# Patient Record
Sex: Female | Born: 1995
Health system: Southern US, Community
[De-identification: ages and names within clinical notes are randomized; demographics above are authoritative.]

## PROBLEM LIST (undated history)

## (undated) DIAGNOSIS — D649 Anemia, unspecified: Secondary | ICD-10-CM

## (undated) DIAGNOSIS — E0500 Thyrotoxicosis with diffuse goiter without thyrotoxic crisis or storm: Secondary | ICD-10-CM

## (undated) DIAGNOSIS — N1831 Chronic kidney disease, stage 3a: Secondary | ICD-10-CM

## (undated) DIAGNOSIS — E109 Type 1 diabetes mellitus without complications: Secondary | ICD-10-CM

## (undated) HISTORY — DX: Anemia, unspecified: D64.9

## (undated) HISTORY — DX: Type 1 diabetes mellitus without complications: E10.9

## (undated) HISTORY — DX: Thyrotoxicosis with diffuse goiter without thyrotoxic crisis or storm: E05.00

## (undated) HISTORY — DX: Chronic kidney disease, stage 3a: N18.31

---
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
## 2014-06-05 DIAGNOSIS — D649 Anemia, unspecified: Secondary | ICD-10-CM | POA: Insufficient documentation

## 2014-06-05 DIAGNOSIS — E109 Type 1 diabetes mellitus without complications: Secondary | ICD-10-CM | POA: Insufficient documentation

## 2017-10-29 DIAGNOSIS — B009 Herpesviral infection, unspecified: Secondary | ICD-10-CM | POA: Insufficient documentation

## 2017-11-02 HISTORY — PX: WISDOM TOOTH EXTRACTION: SHX21

## 2018-12-14 DIAGNOSIS — E0500 Thyrotoxicosis with diffuse goiter without thyrotoxic crisis or storm: Secondary | ICD-10-CM | POA: Insufficient documentation

## 2018-12-14 DIAGNOSIS — E1065 Type 1 diabetes mellitus with hyperglycemia: Secondary | ICD-10-CM | POA: Insufficient documentation

## 2019-01-13 DIAGNOSIS — Z9641 Presence of insulin pump (external) (internal): Secondary | ICD-10-CM | POA: Insufficient documentation

## 2019-08-28 DIAGNOSIS — Z975 Presence of (intrauterine) contraceptive device: Secondary | ICD-10-CM | POA: Insufficient documentation

## 2020-08-19 ENCOUNTER — Encounter: Payer: Self-pay | Admitting: Medical

## 2020-08-19 ENCOUNTER — Other Ambulatory Visit: Payer: Self-pay

## 2020-08-19 ENCOUNTER — Telehealth: Payer: Self-pay | Admitting: Medical

## 2020-08-19 DIAGNOSIS — B370 Candidal stomatitis: Secondary | ICD-10-CM

## 2020-08-19 DIAGNOSIS — R070 Pain in throat: Secondary | ICD-10-CM

## 2020-08-19 DIAGNOSIS — N939 Abnormal uterine and vaginal bleeding, unspecified: Secondary | ICD-10-CM | POA: Insufficient documentation

## 2020-08-19 DIAGNOSIS — E10638 Type 1 diabetes mellitus with other oral complications: Secondary | ICD-10-CM

## 2020-08-19 MED ORDER — CLOTRIMAZOLE 10 MG MT TROC: 10.0000 mg | Freq: Every day | OROMUCOSAL | 0 refills | Status: DC

## 2020-08-19 NOTE — Progress Notes (Signed)
   Subjective:    Patient ID: Patricia Ramirez, female    DOB: 1996-02-18, 25 y.o.   MRN: [id number]   HPI 24 yo female in non acute distress, consents to  Telemedicine appointment. She has had throat discomfort x 1 year.  She was seen at an urgent care 1 week ago was negative Covid-19 test . She just moved here from [place] and after 2 years has moved back to [place]. She takes Zyrtec and Fluticasone daily for allergies. She feels that her symptoms ( throat discomfort) mild nasal stuffiness are just her allergies but not 100% sure.    Moderna vaccinated on April 6th 2021 Booster Moderna on  September 7th 2021  Pregnancy status:  Has Mirena, not pregnant   Review of Systems  Constitutional: Negative for chills and fever.  HENT: Positive for congestion (he normal allergy symptoms) and sore throat. Negative for rhinorrhea, sinus pressure and sinus pain.   Gastrointestinal: Negative for abdominal pain, diarrhea, nausea and vomiting.  Genitourinary: Negative for dysuria.  Musculoskeletal: Negative for myalgias.  Skin: Negative for rash.  Allergic/Immunologic: Positive for environmental allergies (worse in [place] just moved back fron [place] stayed 2 years)).  Neurological: Negative for dizziness, syncope, light-headedness and headaches.  Hematological: Positive for adenopathy (tender on left side and milldly enlarged per patient)..   No history of indigestion. She did try a 2 week regimen and states it helps some but did not resolve the throat discomfort.   No Shortness of breath, no cough, no chest pain. Hot or spicy or acidic foods do not bother her throat. Marland Kitchen    YO:MAYO  meds Zyrtec, Fluticasone, Novolog    Family Hx Father CAD and A-Fib Mother with hyperlipidemia,hypothyroidism, Lupus ,  Hx of Breast Ca in family, though her mother has not had it.  Colonoscopy at age  76yo while she was being worked up for Type 1 DM    Objective:   Physical Exam  AXOX3 No  physical exam due to telemedicine appointment.      Assessment & Plan:  Throat discomfort x 1 year Will refer to ENT Will treat as if it is candidiasis of the throat with  Meds ordered this encounter  Medications  . clotrimazole (MYCELEX) 10 MG troche    Sig: Take 1 tablet (10 mg total) by mouth 5 (five) times daily. Let dissolve in mouth    Dispense:  50 Troche    Refill:  0  if patient does not hear from ENT in 7 business days to call the office. Patient verbalizes understanding and has no questions at the end of our conversation. I asked patient to keep me updated on her diagnosis she agrees she will do so.

## 2020-08-19 NOTE — Patient Instructions (Signed)
Pharyngitis  Pharyngitis is a sore throat (pharynx). This is when there is redness, pain, and swelling in your throat. Most of the time, this condition gets better on its own. In some cases, you may need medicine. Follow these instructions at home:  Take over-the-counter and prescription medicines only as told by your doctor. ? If you were prescribed an antibiotic medicine, take it as told by your doctor. Do not stop taking the antibiotic even if you start to feel better. ? Do not give children aspirin. Aspirin has been linked to Reye syndrome.  Drink enough water and fluids to keep your pee (urine) clear or pale yellow.  Get a lot of rest.  Rinse your mouth (gargle) with a salt-water mixture 3-4 times a day or as needed. To make a salt-water mixture, completely dissolve -1 tsp of salt in 1 cup of warm water.  If your doctor approves, you may use throat lozenges or sprays to soothe your throat. Contact a doctor if:  You have large, tender lumps in your neck.  You have a rash.  You cough up green, yellow-brown, or bloody spit. Get help right away if:  You have a stiff neck.  You drool or cannot swallow liquids.  You cannot drink or take medicines without throwing up.  You have very bad pain that does not go away with medicine.  You have problems breathing, and it is not from a stuffy nose.  You have new pain and swelling in your knees, ankles, wrists, or elbows. Summary  Pharyngitis is a sore throat (pharynx). This is when there is redness, pain, and swelling in your throat.  If you were prescribed an antibiotic medicine, take it as told by your doctor. Do not stop taking the antibiotic even if you start to feel better.  Most of the time, pharyngitis gets better on its own. Sometimes, you may need medicine. This information is not intended to replace advice given to you by your health care provider. Make sure you discuss any questions you have with your health care  provider. Document Revised: 10/01/2017 Document Reviewed: 11/24/2016 Elsevier Patient Education  2020 Elsevier Inc.  

## 2020-10-07 DIAGNOSIS — E559 Vitamin D deficiency, unspecified: Secondary | ICD-10-CM | POA: Diagnosis not present

## 2020-10-07 DIAGNOSIS — E1069 Type 1 diabetes mellitus with other specified complication: Secondary | ICD-10-CM | POA: Diagnosis not present

## 2020-10-07 DIAGNOSIS — E782 Mixed hyperlipidemia: Secondary | ICD-10-CM | POA: Diagnosis not present

## 2020-10-07 DIAGNOSIS — E109 Type 1 diabetes mellitus without complications: Secondary | ICD-10-CM | POA: Diagnosis not present

## 2020-10-22 ENCOUNTER — Ambulatory Visit (INDEPENDENT_AMBULATORY_CARE_PROVIDER_SITE_OTHER): Payer: BC Managed Care – PPO | Admitting: Obstetrics and Gynecology

## 2020-10-22 ENCOUNTER — Encounter: Payer: Self-pay | Admitting: Obstetrics and Gynecology

## 2020-10-22 ENCOUNTER — Other Ambulatory Visit: Payer: Self-pay

## 2020-10-22 VITALS — BP 122/82 | HR 89 | Ht 64.5 in | Wt 205.1 lb

## 2020-10-22 DIAGNOSIS — Z8619 Personal history of other infectious and parasitic diseases: Secondary | ICD-10-CM

## 2020-10-22 DIAGNOSIS — E109 Type 1 diabetes mellitus without complications: Secondary | ICD-10-CM | POA: Diagnosis not present

## 2020-10-22 DIAGNOSIS — Z01419 Encounter for gynecological examination (general) (routine) without abnormal findings: Secondary | ICD-10-CM | POA: Diagnosis not present

## 2020-10-22 MED ORDER — VALACYCLOVIR HCL 500 MG PO TABS: 500.0000 mg | ORAL_TABLET | Freq: Every day | ORAL | 3 refills | Status: DC

## 2020-10-22 NOTE — Progress Notes (Signed)
HPI:      [name] is a 24 y.o. G0P0000 who LMP was No LMP recorded. (Menstrual status: IUD).  Subjective:   She presents today for her annual examination and to establish care. She has no complaints. She has an IUD in place for birth control. She is not having menses with it. She had a placed in 2017 (Mirena). Patient has type 1 diabetes and has an insulin pump. She is followed by endocrinology. Her last Pap smear was 2 years ago and she reports her Pap was normal.    Hx: The following portions of the patient's history were reviewed and updated as appropriate:             She  has a past medical history of Anemia, Graves' disease in remission, and Type 1 diabetes ([place]). She does not have a problem list on file. She  has a past surgical history that includes Colonoscopy (2004) and Wisdom tooth extraction (2019). Her family history includes Hypertension in her father. She  reports that she has never smoked. She has never used smokeless tobacco. She reports current alcohol use. She reports that she does not use drugs. She has a current medication list which includes the following prescription(s): alprazolam, contour next monitor, vitamin d3, contour next test, novolog, and valacyclovir. She is allergic to tuberculin tests.       Review of Systems:  Review of Systems  Constitutional: Denied constitutional symptoms, night sweats, recent illness, fatigue, fever, insomnia and weight loss.  Eyes: Denied eye symptoms, eye pain, photophobia, vision change and visual disturbance.  Ears/Nose/Throat/Neck: Denied ear, nose, throat or neck symptoms, hearing loss, nasal discharge, sinus congestion and sore throat.  Cardiovascular: Denied cardiovascular symptoms, arrhythmia, chest pain/pressure, edema, exercise intolerance, orthopnea and palpitations.  Respiratory: Denied pulmonary symptoms, asthma, pleuritic pain, productive sputum, cough, dyspnea and wheezing.  Gastrointestinal: Denied,  gastro-esophageal reflux, melena, nausea and vomiting.  Genitourinary: Denied genitourinary symptoms including symptomatic vaginal discharge, pelvic relaxation issues, and urinary complaints.  Musculoskeletal: Denied musculoskeletal symptoms, stiffness, swelling, muscle weakness and myalgia.  Dermatologic: Denied dermatology symptoms, rash and scar.  Neurologic: Denied neurology symptoms, dizziness, headache, neck pain and syncope.  Psychiatric: Denied psychiatric symptoms, anxiety and depression.  Endocrine: Denied endocrine symptoms including hot flashes and night sweats.   Meds:   Current Outpatient Medications on File Prior to Visit  Medication Sig Dispense Refill  . ALPRAZolam (XANAX) 0.25 MG tablet Take 0.25 mg by mouth at bedtime as needed for anxiety.    . Blood Glucose Monitoring Suppl (CONTOUR NEXT MONITOR) w/Device KIT     . Cholecalciferol (VITAMIN D3) 75 MCG (3000 UT) TABS Take by mouth.    . CONTOUR NEXT TEST test strip     . NOVOLOG 100 UNIT/ML injection Inject into the skin.    . valACYclovir (VALTREX) 500 MG tablet Take 500 mg by mouth daily.     No current facility-administered medications on file prior to visit.       Upstream - 10/22/20 1447      Pregnancy Intention Screening   Does the patient want to become pregnant in the next year? No    Does the patient's partner want to become pregnant in the next year? No    Would the patient like to discuss contraceptive options today? Yes          The pregnancy intention screening data noted above was reviewed. Potential methods of contraception were discussed. The patient elected to proceed with IUD or  IUS.     Objective:     Vitals:   10/22/20 1438  BP: 122/82  Pulse: 89    Filed Weights   10/22/20 1438  Weight: 205 lb 1.6 oz (93 kg)              Physical examination General NAD, Conversant  HEENT Atraumatic; Op clear with mmm.  Normo-cephalic. Pupils reactive. Anicteric sclerae  Thyroid/Neck Smooth  without nodularity or enlargement. Normal ROM.  Neck Supple.  Skin No rashes, lesions or ulceration. Normal palpated skin turgor. No nodularity.  Breasts: No masses or discharge.  Symmetric.  No axillary adenopathy.  Lungs: Clear to auscultation.No rales or wheezes. Normal Respiratory effort, no retractions.  Heart: NSR.  No murmurs or rubs appreciated. No periferal edema  Abdomen: Soft.  Non-tender.  No masses.  No HSM. No hernia  Extremities: Moves all appropriately.  Normal ROM for age. No lymphadenopathy.  Neuro: Oriented to PPT.  Normal mood. Normal affect.     Pelvic:   Vulva: Normal appearance.  No lesions.  Vagina: No lesions or abnormalities noted.  Support: Normal pelvic support.  Urethra No masses tenderness or scarring.  Meatus Normal size without lesions or prolapse.  Cervix: Normal appearance.  No lesions. IUD strings noted at cervical os  Anus: Normal exam.  No lesions.  Perineum: Normal exam.  No lesions.        Bimanual   Uterus: Normal size.  Non-tender.  Mobile.  AV.  Adnexae: No masses.  Non-tender to palpation.  Cul-de-sac: Negative for abnormality.      Assessment:    G0P0000 There are no problems to display for this patient.    1. Well woman exam with routine gynecological exam   2. Type 1 diabetes mellitus without complication (HCC)   3. History of PCR DNA positive for HSV1     Normal exam.   Plan:            1.  Basic Screening Recommendations The basic screening recommendations for asymptomatic women were discussed with the patient during her visit.  The age-appropriate recommendations were discussed with her and the rational for the tests reviewed.  When I am informed by the patient that another primary care physician has previously obtained the age-appropriate tests and they are up-to-date, only outstanding tests are ordered and referrals given as necessary.  Abnormal results of tests will be discussed with her when all of her results are  completed.  Routine preventative health maintenance measures emphasized: Exercise/Diet/Weight control, Tobacco Warnings, Alcohol/Substance use risks and Stress Management Pap next year 2. Patient discontinued Valtrex and had an outbreak this year. She would like to remain on daily Valtrex for at least the next year to prevent recurrence. Orders No orders of the defined types were placed in this encounter.   No orders of the defined types were placed in this encounter.           F/U  Return in about 1 year (around 10/22/2021) for Annual Physical.  Finis Bud, M.D. 10/22/2020 3:19 PM

## 2020-10-29 ENCOUNTER — Telehealth: Payer: Self-pay

## 2020-10-29 NOTE — Telephone Encounter (Signed)
Patient called in stating she received a text message from MyChart about verifying a Rx that was pending a refill. Patient states it was asking her to verify her DOB and when she inputted that information it told her that the DOB didn't match the patients demographics. Patient states she is needing a refill on her Valtrex and wanted to make sure she is able to get that refilled since she is unable to verify the refill via the link that was sent to her.   Could you please advise?

## 2020-10-29 NOTE — Telephone Encounter (Signed)
LM for patient to return call.

## 2020-10-31 NOTE — Telephone Encounter (Signed)
LM for patient that RX had been sent in to pharmacy the day of appointment. If she has any questions to give [place] a call back.

## 2020-11-05 ENCOUNTER — Telehealth: Payer: Self-pay | Admitting: Medical

## 2020-11-05 ENCOUNTER — Other Ambulatory Visit: Payer: Self-pay

## 2020-11-05 DIAGNOSIS — E10638 Type 1 diabetes mellitus with other oral complications: Secondary | ICD-10-CM

## 2020-11-05 DIAGNOSIS — B370 Candidal stomatitis: Secondary | ICD-10-CM

## 2020-11-05 DIAGNOSIS — R070 Pain in throat: Secondary | ICD-10-CM

## 2020-11-05 DIAGNOSIS — Z760 Encounter for issue of repeat prescription: Secondary | ICD-10-CM

## 2020-11-05 MED ORDER — CLOTRIMAZOLE 10 MG MT TROC: 10.0000 mg | Freq: Every day | OROMUCOSAL | 0 refills | Status: DC

## 2020-11-05 NOTE — Progress Notes (Signed)
   Subjective:    Patient ID: Patricia Ramirez, female    DOB: 09/22/1996, 25 y.o.   MRN: [id number]  HPI  25 yo female in non acute distress, consents to telemedicine appointment. Started 7 days ago with  White on tongue and on back of throat. States that the area is painful. She is is a IDDM patient. The last time I treated patient for thrush was Oct.18,21. She states this feels just like before. She would like a refill of the Cloritrimazole troches. No known Covid exposure.  Review of Systems  Constitutional: Negative for chills and fever.  HENT: Positive for sore throat. Negative for congestion.   Respiratory: Negative for cough, chest tightness, shortness of breath and wheezing.   Cardiovascular: Negative for chest pain.  Genitourinary: Negative for difficulty urinating.  Skin: Negative for color change and rash.  Neurological: Negative for dizziness, syncope, light-headedness and headaches.       Objective:   Physical Exam  AXOX3 No physical was performed due to telemedicine appointment.      Assessment & Plan:  Candidiasis Refill of medication She has appointnment with a PCP in the next 2 weeks. She may return to this clinic as needed.  She verbalizes understanding and has no questions at the end of our conversation.

## 2020-11-15 ENCOUNTER — Ambulatory Visit: Payer: BC Managed Care – PPO

## 2020-11-15 ENCOUNTER — Telehealth: Payer: BC Managed Care – PPO | Admitting: Nurse Practitioner

## 2020-11-15 ENCOUNTER — Other Ambulatory Visit: Payer: Self-pay

## 2020-11-15 DIAGNOSIS — Z20822 Contact with and (suspected) exposure to covid-19: Secondary | ICD-10-CM

## 2020-11-15 DIAGNOSIS — J029 Acute pharyngitis, unspecified: Secondary | ICD-10-CM

## 2020-11-15 LAB — POC COVID19 BINAXNOW: SARS Coronavirus 2 Ag: NEGATIVE

## 2020-11-15 NOTE — Progress Notes (Signed)
   Subjective:    Patient ID: Patricia Ramirez, female    DOB: 1996-09-18, 25 y.o.   MRN: [id number]  HPI  25 year old female presenting to clinic today for COVID testing   She has been vaccinated for COVID-19 with a booster  One week ago she was congested with a sore throat, she was not tested at that time then her husband tested positive this morning so she presents to clinic today for COVID testing.   SpO2 99% Heart Rate 92  Phone [phone]    Past Medical History:  Diagnosis Date  . Anemia   . Graves' disease in remission   . Type 1 diabetes (HCC)    Review of Systems  Constitutional: Negative.   HENT: Positive for congestion and sore throat.   Respiratory: Negative.   Cardiovascular: Negative.   Genitourinary: Negative.   Musculoskeletal: Negative.        Objective:   Physical Exam  This was a telehealth appointment with patient after nurse visit for COVID-19 testing.   No acute distress assessed during nurse visit or phone conversation with patient   Recent Results (from the past 2160 hour(s))  POC COVID-19     Status: Normal   Collection Time: 11/15/20  1:32 PM  Result Value Ref Range   SARS Coronavirus 2 Ag Negative Negative      Assessment & Plan:  Will send out PCR for confirmation of negative test today  Advised to isolate until PCR returns, treat symptoms with OTC medications.   Will discuss return to work when PCR returns, she has had symptoms for over one week already and then had negative rapid testing so likely if symptoms have improved without any new onset of symptoms she is already outside of her isolation period.   She will seek immediate medical attention for any new or worsening symptoms

## 2020-11-15 NOTE — Progress Notes (Signed)
Patient was vaccinated with booster, was symptomatic with fever, cough, yeast infection as per pt week ago and sore throat aware of POC negative result has virtual with Viviano Simas FNP.

## 2020-11-17 ENCOUNTER — Encounter: Payer: Self-pay | Admitting: Nurse Practitioner

## 2020-11-17 LAB — SARS-COV-2, NAA 2 DAY TAT

## 2020-11-17 LAB — NOVEL CORONAVIRUS, NAA: SARS-CoV-2, NAA: NOT DETECTED

## 2021-01-27 DIAGNOSIS — E559 Vitamin D deficiency, unspecified: Secondary | ICD-10-CM | POA: Diagnosis not present

## 2021-01-29 DIAGNOSIS — E109 Type 1 diabetes mellitus without complications: Secondary | ICD-10-CM | POA: Diagnosis not present

## 2021-03-12 ENCOUNTER — Telehealth: Payer: BC Managed Care – PPO | Admitting: Medical

## 2021-03-12 ENCOUNTER — Other Ambulatory Visit: Payer: Self-pay

## 2021-03-12 DIAGNOSIS — U071 COVID-19: Secondary | ICD-10-CM

## 2021-03-12 NOTE — Progress Notes (Signed)
   Subjective:    Patient ID: Patricia Ramirez, female    DOB: [birth year], 25 y.o.   MRN: [id number]  HPI 25 yo female in non acute distress consents to telemedicine appointment. She tested positive yesterday. She has 2 close contacts.Her symptoms are nasal congestion, cough ,possibly a fever but had not taken her temperature. Post nasal drip, runnny nose and  ST. Mild shortness of breath. Patient had Covid in Jan 2022 and she is a type I diabetic.  Covid in Jan. 2022 No lost of taste or smell Review of Systems  Constitutional: Positive for chills and fever (unsure has not felt feverish).  HENT: Positive for congestion, postnasal drip, rhinorrhea, sinus pressure and sore throat. Negative for ear pain and sneezing.   Respiratory: Positive for cough and shortness of breath (mild).   Gastrointestinal: Positive for abdominal pain and diarrhea.  Allergic/Immunologic: Positive for environmental allergies (mild does not usually medicate).  Neurological: Positive for dizziness (baseline due to her diabetes.) and headaches (frontal and cheek pressure).     campus on Monday tested negative Objective:   Physical Exam  No physical exam due to telemedicine appointment      Assessment & Plan:  Covid -19 infection Isolate, rest, increase fluids, OTC Motrin or Tylenol for fever or pain.May return to work on May 16 if all symptoms are improving and no fever x 24 hours with no Miotrin or Tylenol being taken. If sugar levels increase she is to call her endocrinologist. Patient verbalizes understanding and has no questions  At the end of our conversation.

## 2021-03-12 NOTE — Patient Instructions (Signed)

## 2021-05-07 ENCOUNTER — Ambulatory Visit: Payer: BC Managed Care – PPO | Admitting: Nurse Practitioner

## 2021-05-07 ENCOUNTER — Other Ambulatory Visit: Payer: Self-pay

## 2021-05-07 VITALS — BP 120/88 | HR 106 | Temp 99.4°F | Resp 16

## 2021-05-07 DIAGNOSIS — J029 Acute pharyngitis, unspecified: Secondary | ICD-10-CM

## 2021-05-07 DIAGNOSIS — E109 Type 1 diabetes mellitus without complications: Secondary | ICD-10-CM

## 2021-05-07 DIAGNOSIS — B370 Candidal stomatitis: Secondary | ICD-10-CM

## 2021-05-07 LAB — POCT RAPID STREP A (OFFICE): Rapid Strep A Screen: NEGATIVE

## 2021-05-07 LAB — POC COVID19 BINAXNOW: SARS Coronavirus 2 Ag: NEGATIVE

## 2021-05-07 MED ORDER — NYSTATIN 100000 UNIT/ML MT SUSP: OROMUCOSAL | 1 refills | Status: DC

## 2021-05-07 MED ORDER — FLUCONAZOLE 150 MG PO TABS: 150.0000 mg | ORAL_TABLET | Freq: Once | ORAL | 0 refills | Status: AC

## 2021-05-07 NOTE — Progress Notes (Signed)
Subjective:    Patient ID: Patricia Ramirez, female    DOB: 11/25/1995, 25 y.o.   MRN: [id number]  HPI  25 year old female presenting to [facility] with complaints of sore throat for the past 2 weeks. She has a history of a DM and her Blood glucose levels have been high as well for the past 2 weeks. She has a pump and continuosu monitor glucose levels have been up to 400.   Next Endocrine appointment is 06/02/21  She has had a history of oral thrush in the past.    She did take a home COVID test that was negative.   She has also had HA and some body aches that she related to her elevated sugars.   Today's Vitals   05/07/21 1335  BP: 120/88  Pulse: (!) 106  Resp: 16  Temp: 99.4 F (37.4 C)  TempSrc: Tympanic  SpO2: 99%   There is no height or weight on file to calculate BMI.   Review of Systems  Constitutional:  Positive for fatigue.  HENT:  Positive for sore throat.   Respiratory: Negative.    Cardiovascular: Negative.   Genitourinary: Negative.   Neurological: Negative.     Past Medical History:  Diagnosis Date   Anemia    Graves' disease in remission    Type 1 diabetes ([place])     Current Outpatient Medications  Medication Instructions   ALPRAZolam (XANAX) 0.25 mg, Oral, At bedtime PRN   Blood Glucose Monitoring Suppl (CONTOUR NEXT MONITOR) w/Device KIT No dose, route, or frequency recorded.   Blood Glucose Monitoring Suppl (FIFTY50 GLUCOSE METER 2.0) w/Device KIT Use as directed ONE TOUCH VERIO IQ METER E10.65   Cholecalciferol 50 MCG (2000 UT) TABS Oral   CONTOUR NEXT TEST test strip No dose, route, or frequency recorded.   ergocalciferol (VITAMIN D2) 1.25 MG (50000 UT) capsule 1 capsule, Oral, Weekly   ferrous sulfate 325 (65 FE) MG tablet Oral   fluconazole (DIFLUCAN) 150 mg, Oral,  Once   glucagon 1 MG injection Administer subcutaneously for severe hypoglycemia when other corrections have not been successful.   insulin aspart (NOVOLOG) 100 UNIT/ML injection INJECT  SUBCUTANEOUSLY VIA INSULIN PUMP UP TO 90 UNITS/DAY   insulin lispro (HUMALOG) 100 UNIT/ML injection Use upto 90 units per day   Lancets (ONETOUCH ULTRASOFT) lancets Use.   levonorgestrel (MIRENA) 20 MCG/DAY IUD Intrauterine   nystatin (MYCOSTATIN) 100000 UNIT/ML suspension Swish and spit 68m four times daily.   valACYclovir (VALTREX) 500 mg, Oral, Daily, Can increase to twice a day for 5 days in the event of a recurrence        Objective:   Physical Exam Constitutional:      Appearance: She is well-developed.  HENT:     Head: Normocephalic.     Right Ear: Tympanic membrane, ear canal and external ear normal.     Left Ear: Tympanic membrane, ear canal and external ear normal.     Nose: Nose normal. No congestion or rhinorrhea.     Mouth/Throat:     Mouth: Mucous membranes are moist.     Pharynx: No pharyngeal swelling or posterior oropharyngeal erythema.     Tonsils: No tonsillar exudate.      Comments: Thick white/yellow plaque to tongue as highlighted  Cardiovascular:     Rate and Rhythm: Regular rhythm. Tachycardia present.     Heart sounds: Normal heart sounds.  Pulmonary:     Effort: Pulmonary effort is normal.  Breath sounds: Normal breath sounds.  Musculoskeletal:     Cervical back: Normal range of motion.  Skin:    General: Skin is warm.  Neurological:     General: No focal deficit present.     Mental Status: She is alert.    Recent Results (from the past 2160 hour(s))  POC COVID-19     Status: Normal   Collection Time: 05/07/21  1:57 PM  Result Value Ref Range   SARS Coronavirus 2 Ag Negative Negative  POCT rapid strep A     Status: Normal   Collection Time: 05/07/21  1:58 PM  Result Value Ref Range   Rapid Strep A Screen Negative Negative         Assessment & Plan:  1. Thrush  - nystatin (MYCOSTATIN) 100000 UNIT/ML suspension; Swish and spit 69m four times daily.  Dispense: 100 mL; Refill: 1 - fluconazole (DIFLUCAN) 150 MG tablet; Take 1 tablet (150  mg total) by mouth once for 1 dose.  Dispense: 1 tablet; Refill: 0  2. Sore throat Rapid COVID and Strep in office negative- will send out labs to evaluate for viral/bacterial component to ongoing symptoms and will follow up with results when available.   - CBC with Differential/Platelet - Hgb A1c w/o eAG - Comprehensive metabolic panel - Thyroid Panel With TSH  3. Type 1 diabetes mellitus without complication (HCC) Continue to monitor glucose levels closely. Seek medical attention for any acutely worsening elevation in glucose levels non responsive to insulin pump infusion therapy.   Will evaluate labs for cause of elevation and refer back to endocrine as needed.    Dexicom Glucose level @ 265 at time of peripheral blood draw.

## 2021-05-08 ENCOUNTER — Telehealth: Payer: Self-pay | Admitting: Nurse Practitioner

## 2021-05-08 LAB — CBC WITH DIFFERENTIAL/PLATELET
Basophils Absolute: 0 10*3/uL (ref 0.0–0.2)
Basos: 0 %
EOS (ABSOLUTE): 0.1 10*3/uL (ref 0.0–0.4)
Eos: 1 %
Hematocrit: 29.6 % — ABNORMAL LOW (ref 34.0–46.6)
Hemoglobin: 8 g/dL — ABNORMAL LOW (ref 11.1–15.9)
Immature Grans (Abs): 0 10*3/uL (ref 0.0–0.1)
Immature Granulocytes: 0 %
Lymphocytes Absolute: 2.6 10*3/uL (ref 0.7–3.1)
Lymphs: 35 %
MCH: 16.5 pg — ABNORMAL LOW (ref 26.6–33.0)
MCHC: 27 g/dL — ABNORMAL LOW (ref 31.5–35.7)
MCV: 61 fL — ABNORMAL LOW (ref 79–97)
Monocytes Absolute: 0.9 10*3/uL (ref 0.1–0.9)
Monocytes: 13 %
Neutrophils Absolute: 3.7 10*3/uL (ref 1.4–7.0)
Neutrophils: 51 %
Platelets: 387 10*3/uL (ref 150–450)
RBC: 4.84 x10E6/uL (ref 3.77–5.28)
RDW: 19.8 % — ABNORMAL HIGH (ref 11.7–15.4)
WBC: 7.3 10*3/uL (ref 3.4–10.8)

## 2021-05-08 LAB — COMPREHENSIVE METABOLIC PANEL
ALT: 22 IU/L (ref 0–32)
AST: 24 IU/L (ref 0–40)
Albumin/Globulin Ratio: 1.3 (ref 1.2–2.2)
Albumin: 4 g/dL (ref 3.9–5.0)
Alkaline Phosphatase: 66 IU/L (ref 44–121)
BUN/Creatinine Ratio: 14 (ref 9–23)
BUN: 16 mg/dL (ref 6–20)
Bilirubin Total: <0.2 mg/dL (ref 0.0–1.2)
CO2: 20 mmol/L (ref 20–29)
Calcium: 9 mg/dL (ref 8.7–10.2)
Chloride: 101 mmol/L (ref 96–106)
Creatinine, Ser: 1.13 mg/dL — ABNORMAL HIGH (ref 0.57–1.00)
Globulin, Total: 3.1 g/dL (ref 1.5–4.5)
Glucose: 275 mg/dL — ABNORMAL HIGH (ref 65–99)
Potassium: 4.4 mmol/L (ref 3.5–5.2)
Sodium: 135 mmol/L (ref 134–144)
Total Protein: 7.1 g/dL (ref 6.0–8.5)
eGFR: 68 mL/min/{1.73_m2} (ref >59)

## 2021-05-08 LAB — THYROID PANEL WITH TSH
Free Thyroxine Index: 1.6 (ref 1.2–4.9)
T3 Uptake Ratio: 25 % (ref 24–39)
T4, Total: 6.2 ug/dL (ref 4.5–12.0)
TSH: 7.29 u[IU]/mL — ABNORMAL HIGH (ref 0.450–4.500)

## 2021-05-08 LAB — HGB A1C W/O EAG: Hgb A1c MFr Bld: 8.1 % — ABNORMAL HIGH (ref 4.8–5.6)

## 2021-05-08 NOTE — Telephone Encounter (Signed)
Spoke with patient regarding lab results from yesterday. No evidence of acute viral or bacterial infection.   A1C 8.1 and TSH 7.29.  Advised patient to make follow up with Endocrinology to discuss ongoing elevated blood glucose levels and symptoms.   Continue thrush management.  Plan to add Iron and TIBC to labs from yesterday and f/u with patient with results when available.  Consider Hematology referral as needed.   No PCP at this time.   Recent Results (from the past 2160 hour(s))  CBC with Differential/Platelet     Status: Abnormal   Collection Time: 05/07/21  1:49 PM  Result Value Ref Range   WBC 7.3 3.4 - 10.8 x10E3/uL   RBC 4.84 3.77 - 5.28 x10E6/uL   Hemoglobin 8.0 (L) 11.1 - 15.9 g/dL   Hematocrit 29.6 (L) 34.0 - 46.6 %   MCV 61 (L) 79 - 97 fL   MCH 16.5 (L) 26.6 - 33.0 pg   MCHC 27.0 (L) 31.5 - 35.7 g/dL   RDW 19.8 (H) 11.7 - 15.4 %   Platelets 387 150 - 450 x10E3/uL   Neutrophils 51 Not Estab. %   Lymphs 35 Not Estab. %   Monocytes 13 Not Estab. %   Eos 1 Not Estab. %   Basos 0 Not Estab. %   Neutrophils Absolute 3.7 1.4 - 7.0 x10E3/uL   Lymphocytes Absolute 2.6 0.7 - 3.1 x10E3/uL   Monocytes Absolute 0.9 0.1 - 0.9 x10E3/uL   EOS (ABSOLUTE) 0.1 0.0 - 0.4 x10E3/uL   Basophils Absolute 0.0 0.0 - 0.2 x10E3/uL   Immature Granulocytes 0 Not Estab. %   Immature Grans (Abs) 0.0 0.0 - 0.1 x10E3/uL  Hgb A1c w/o eAG     Status: Abnormal   Collection Time: 05/07/21  1:49 PM  Result Value Ref Range   Hgb A1c MFr Bld 8.1 (H) 4.8 - 5.6 %    Comment:          Prediabetes: 5.7 - 6.4          Diabetes: >6.4          Glycemic control for adults with diabetes: <7.0   Comprehensive metabolic panel     Status: Abnormal   Collection Time: 05/07/21  1:49 PM  Result Value Ref Range   Glucose 275 (H) 65 - 99 mg/dL   BUN 16 6 - 20 mg/dL   Creatinine, Ser 1.13 (H) 0.57 - 1.00 mg/dL   eGFR 68 >59 mL/min/1.73   BUN/Creatinine Ratio 14 9 - 23   Sodium 135 134 - 144 mmol/L    Potassium 4.4 3.5 - 5.2 mmol/L   Chloride 101 96 - 106 mmol/L   CO2 20 20 - 29 mmol/L   Calcium 9.0 8.7 - 10.2 mg/dL   Total Protein 7.1 6.0 - 8.5 g/dL   Albumin 4.0 3.9 - 5.0 g/dL   Globulin, Total 3.1 1.5 - 4.5 g/dL   Albumin/Globulin Ratio 1.3 1.2 - 2.2   Bilirubin Total <0.2 0.0 - 1.2 mg/dL   Alkaline Phosphatase 66 44 - 121 IU/L   AST 24 0 - 40 IU/L   ALT 22 0 - 32 IU/L  Thyroid Panel With TSH     Status: Abnormal   Collection Time: 05/07/21  1:49 PM  Result Value Ref Range   TSH 7.290 (H) 0.450 - 4.500 uIU/mL   T4, Total 6.2 4.5 - 12.0 ug/dL   T3 Uptake Ratio 25 24 - 39 %   Free Thyroxine Index 1.6 1.2 -  4.9  POC COVID-19     Status: Normal   Collection Time: 05/07/21  1:57 PM  Result Value Ref Range   SARS Coronavirus 2 Ag Negative Negative  POCT rapid strep A     Status: Normal   Collection Time: 05/07/21  1:58 PM  Result Value Ref Range   Rapid Strep A Screen Negative Negative

## 2021-05-11 ENCOUNTER — Encounter: Payer: Self-pay | Admitting: Nurse Practitioner

## 2021-05-11 ENCOUNTER — Other Ambulatory Visit: Payer: Self-pay | Admitting: Nurse Practitioner

## 2021-05-11 DIAGNOSIS — D508 Other iron deficiency anemias: Secondary | ICD-10-CM

## 2021-05-11 NOTE — Progress Notes (Signed)
See previous note.   Patient was seen at [facility] for complaints of sore throat. She was treated for oral thrush. She also had concerns at the time that her glucose levels had been trending higher in the past month.   She does not currently have a PCP, does have endocrinologist.  Past Medical History:  Diagnosis Date   Anemia    Graves' disease in remission    Type 1 diabetes ([place])     Patient also states that she has sickle cell trait. After initial CBC to r/o acute infection H&H were found to be lower than last recorded local lab results available that were from 2019.   Do to marked lower H&H and low iron levels with a history of sickle cell trait patient will be referred to Hematology.   She is also encouraged to schedule an earlier appointment with her endocrinologist to discuss elevated TSH &A1C.   She will continue the oral thrush management and follow up with [place] for those needs if symptoms persist.     Recent Results (from the past 2160 hour(s))  CBC with Differential/Platelet     Status: Abnormal   Collection Time: 05/07/21  1:49 PM  Result Value Ref Range   WBC 7.3 3.4 - 10.8 x10E3/uL   RBC 4.84 3.77 - 5.28 x10E6/uL   Hemoglobin 8.0 (L) 11.1 - 15.9 g/dL   Hematocrit 29.6 (L) 34.0 - 46.6 %   MCV 61 (L) 79 - 97 fL   MCH 16.5 (L) 26.6 - 33.0 pg   MCHC 27.0 (L) 31.5 - 35.7 g/dL   RDW 19.8 (H) 11.7 - 15.4 %   Platelets 387 150 - 450 x10E3/uL   Neutrophils 51 Not Estab. %   Lymphs 35 Not Estab. %   Monocytes 13 Not Estab. %   Eos 1 Not Estab. %   Basos 0 Not Estab. %   Neutrophils Absolute 3.7 1.4 - 7.0 x10E3/uL   Lymphocytes Absolute 2.6 0.7 - 3.1 x10E3/uL   Monocytes Absolute 0.9 0.1 - 0.9 x10E3/uL   EOS (ABSOLUTE) 0.1 0.0 - 0.4 x10E3/uL   Basophils Absolute 0.0 0.0 - 0.2 x10E3/uL   Immature Granulocytes 0 Not Estab. %   Immature Grans (Abs) 0.0 0.0 - 0.1 x10E3/uL  Hgb A1c w/o eAG     Status: Abnormal   Collection Time: 05/07/21  1:49 PM   Result Value Ref Range   Hgb A1c MFr Bld 8.1 (H) 4.8 - 5.6 %    Comment:          Prediabetes: 5.7 - 6.4          Diabetes: >6.4          Glycemic control for adults with diabetes: <7.0   Comprehensive metabolic panel     Status: Abnormal   Collection Time: 05/07/21  1:49 PM  Result Value Ref Range   Glucose 275 (H) 65 - 99 mg/dL   BUN 16 6 - 20 mg/dL   Creatinine, Ser 1.13 (H) 0.57 - 1.00 mg/dL   eGFR 68 >59 mL/min/1.73   BUN/Creatinine Ratio 14 9 - 23   Sodium 135 134 - 144 mmol/L   Potassium 4.4 3.5 - 5.2 mmol/L   Chloride 101 96 - 106 mmol/L   CO2 20 20 - 29 mmol/L   Calcium 9.0 8.7 - 10.2 mg/dL   Total Protein 7.1 6.0 - 8.5 g/dL   Albumin 4.0 3.9 - 5.0 g/dL   Globulin, Total 3.1 1.5 -  4.5 g/dL   Albumin/Globulin Ratio 1.3 1.2 - 2.2   Bilirubin Total <0.2 0.0 - 1.2 mg/dL   Alkaline Phosphatase 66 44 - 121 IU/L   AST 24 0 - 40 IU/L   ALT 22 0 - 32 IU/L  Thyroid Panel With TSH     Status: Abnormal   Collection Time: 05/07/21  1:49 PM  Result Value Ref Range   TSH 7.290 (H) 0.450 - 4.500 uIU/mL   T4, Total 6.2 4.5 - 12.0 ug/dL   T3 Uptake Ratio 25 24 - 39 %   Free Thyroxine Index 1.6 1.2 - 4.9  Iron and TIBC     Status: Abnormal   Collection Time: 05/07/21  1:49 PM  Result Value Ref Range   Total Iron Binding Capacity 366 250 - 450 ug/dL   UIBC 356 131 - 425 ug/dL   Iron 10 (L) 27 - 159 ug/dL   Iron Saturation 3 (LL) 15 - 55 %  Specimen status report     Status: None (Preliminary result)   Collection Time: 05/07/21  1:49 PM  Result Value Ref Range   specimen status report Comment     Comment: Written Authorization Written Authorization   POC COVID-19     Status: Normal   Collection Time: 05/07/21  1:57 PM  Result Value Ref Range   SARS Coronavirus 2 Ag Negative Negative  POCT rapid strep A     Status: Normal   Collection Time: 05/07/21  1:58 PM  Result Value Ref Range   Rapid Strep A Screen Negative Negative

## 2021-05-12 DIAGNOSIS — E109 Type 1 diabetes mellitus without complications: Secondary | ICD-10-CM | POA: Diagnosis not present

## 2021-05-13 ENCOUNTER — Telehealth: Payer: Self-pay | Admitting: Oncology

## 2021-05-13 NOTE — Telephone Encounter (Signed)
05/13/2021 Left VM for pt informing her to arrive at her [facility] appt @ 12:30 instead of 12:45, per MD request. Left call back number in case there were any questions or conflicts SRW

## 2021-05-16 ENCOUNTER — Inpatient Hospital Stay: Payer: BC Managed Care – PPO

## 2021-05-16 ENCOUNTER — Other Ambulatory Visit: Payer: Self-pay

## 2021-05-16 ENCOUNTER — Inpatient Hospital Stay: Payer: BC Managed Care – PPO | Attending: Oncology | Admitting: Oncology

## 2021-05-16 ENCOUNTER — Inpatient Hospital Stay: Payer: BC Managed Care – PPO | Admitting: Oncology

## 2021-05-16 ENCOUNTER — Encounter: Payer: Self-pay | Admitting: Oncology

## 2021-05-16 VITALS — BP 132/91 | HR 88 | Temp 100.0°F | Resp 18 | Wt 219.2 lb

## 2021-05-16 DIAGNOSIS — Z794 Long term (current) use of insulin: Secondary | ICD-10-CM | POA: Diagnosis not present

## 2021-05-16 DIAGNOSIS — R5383 Other fatigue: Secondary | ICD-10-CM

## 2021-05-16 DIAGNOSIS — E109 Type 1 diabetes mellitus without complications: Secondary | ICD-10-CM

## 2021-05-16 DIAGNOSIS — D509 Iron deficiency anemia, unspecified: Secondary | ICD-10-CM

## 2021-05-16 DIAGNOSIS — D573 Sickle-cell trait: Secondary | ICD-10-CM | POA: Diagnosis not present

## 2021-05-16 DIAGNOSIS — R718 Other abnormality of red blood cells: Secondary | ICD-10-CM

## 2021-05-16 LAB — RETIC PANEL
Immature Retic Fract: 18.6 % — ABNORMAL HIGH (ref 2.3–15.9)
RBC.: 4.64 MIL/uL (ref 3.87–5.11)
Retic Count, Absolute: 56.6 10*3/uL (ref 19.0–186.0)
Retic Ct Pct: 1.2 % (ref 0.4–3.1)
Reticulocyte Hemoglobin: 19.4 pg — ABNORMAL LOW (ref >27.9)

## 2021-05-16 LAB — CBC WITH DIFFERENTIAL/PLATELET
Abs Immature Granulocytes: 0.01 10*3/uL (ref 0.00–0.07)
Basophils Absolute: 0 10*3/uL (ref 0.0–0.1)
Basophils Relative: 1 %
Eosinophils Absolute: 0.1 10*3/uL (ref 0.0–0.5)
Eosinophils Relative: 1 %
HCT: 27.3 % — ABNORMAL LOW (ref 36.0–46.0)
Hemoglobin: 8.1 g/dL — ABNORMAL LOW (ref 12.0–15.0)
Immature Granulocytes: 0 %
Lymphocytes Relative: 35 %
Lymphs Abs: 2.4 10*3/uL (ref 0.7–4.0)
MCH: 17.3 pg — ABNORMAL LOW (ref 26.0–34.0)
MCHC: 29.7 g/dL — ABNORMAL LOW (ref 30.0–36.0)
MCV: 58.2 fL — ABNORMAL LOW (ref 80.0–100.0)
Monocytes Absolute: 1 10*3/uL (ref 0.1–1.0)
Monocytes Relative: 14 %
Neutro Abs: 3.3 10*3/uL (ref 1.7–7.7)
Neutrophils Relative %: 49 %
Platelets: 376 10*3/uL (ref 150–400)
RBC: 4.69 MIL/uL (ref 3.87–5.11)
RDW: 19.8 % — ABNORMAL HIGH (ref 11.5–15.5)
WBC: 6.8 10*3/uL (ref 4.0–10.5)
nRBC: 0 % (ref 0.0–0.2)

## 2021-05-16 LAB — LACTATE DEHYDROGENASE: LDH: 153 U/L (ref 98–192)

## 2021-05-16 LAB — TECHNOLOGIST SMEAR REVIEW: Plt Morphology: ADEQUATE

## 2021-05-16 LAB — FERRITIN: Ferritin: 5 ng/mL — ABNORMAL LOW (ref 11–307)

## 2021-05-16 LAB — IRON AND TIBC
Iron: 14 ug/dL — ABNORMAL LOW (ref 28–170)
Saturation Ratios: 3 % — ABNORMAL LOW (ref 10.4–31.8)
TIBC: 458 ug/dL — ABNORMAL HIGH (ref 250–450)
UIBC: 444 ug/dL

## 2021-05-16 NOTE — Progress Notes (Signed)
Hematology/Oncology Consult note [facility] Telephone:([phone] [phone]   Patient Care Team: Pcp, No as PCP - General  REFERRING PROVIDER: Apolonio Schneiders, FNP  CHIEF COMPLAINTS/REASON FOR VISIT:  Evaluation of iron deficiency anemia  HISTORY OF PRESENTING ILLNESS:   Patricia Ramirez is a  25 y.o.  female with PMH listed below was seen in consultation at the request of  Apolonio Schneiders, FNP  for evaluation of iron deficiency anemia Patient reports history of iron deficiency anemia as a teenager. She was told that was due to heavy menstrual bleed.  She has had IUD placed in 2017 and has not had period.  No bloody or tarry stool, no epigastric discomfort.  She has type 1 DM and per patient , she may have diarrhea when blood sugar is not controlled.  She was told by her parents that she has sickle cell trait.   Reports feeling tired.   Review of Systems  Constitutional:  Positive for fatigue. Negative for appetite change, chills and fever.  HENT:   Negative for hearing loss and voice change.   Eyes:  Negative for eye problems.  Respiratory:  Negative for chest tightness and cough.   Cardiovascular:  Negative for chest pain.  Gastrointestinal:  Negative for abdominal distention, abdominal pain and blood in stool.  Endocrine: Negative for hot flashes.  Genitourinary:  Negative for difficulty urinating and frequency.   Musculoskeletal:  Negative for arthralgias.  Skin:  Negative for itching and rash.  Neurological:  Negative for extremity weakness.  Hematological:  Negative for adenopathy.  Psychiatric/Behavioral:  Negative for confusion.    MEDICAL HISTORY:  Past Medical History:  Diagnosis Date   Anemia    Graves' disease in remission    Type 1 diabetes ([place])     SURGICAL HISTORY: Past Surgical History:  Procedure Laterality Date   COLONOSCOPY  2004   WISDOM TOOTH EXTRACTION  2019    SOCIAL HISTORY: Social History   Socioeconomic  History   Marital status: Married    Spouse name: Not on file   Number of children: Not on file   Years of education: Not on file   Highest education level: Not on file  Occupational History   Not on file  Tobacco Use   Smoking status: Never   Smokeless tobacco: Never  Vaping Use   Vaping Use: Never used  Substance and Sexual Activity   Alcohol use: Yes    Comment: occasionaly   Drug use: Never   Sexual activity: Yes    Birth control/protection: I.U.D.  Other Topics Concern   Not on file  Social History Narrative   ** Merged History Encounter **       Social Determinants of Health   Financial Resource Strain: Not on file  Food Insecurity: Not on file  Transportation Needs: Not on file  Physical Activity: Not on file  Stress: Not on file  Social Connections: Not on file  Intimate Partner Violence: Not on file    FAMILY HISTORY: Family History  Problem Relation Age of Onset   Hypertension Father     ALLERGIES:  is allergic to tuberculin tests, other, and tuberculin ppd.  MEDICATIONS:  Current Outpatient Medications  Medication Sig Dispense Refill   ALPRAZolam (XANAX) 0.25 MG tablet Take 0.25 mg by mouth at bedtime as needed for anxiety.     Cholecalciferol 50 MCG (2000 UT) TABS Take by mouth.     CONTOUR NEXT TEST test strip  ergocalciferol (VITAMIN D2) 1.25 MG (50000 UT) capsule Take 1 capsule by mouth once a week.     ferrous sulfate 325 (65 FE) MG tablet Take by mouth.     glucagon 1 MG injection Administer subcutaneously for severe hypoglycemia when other corrections have not been successful.     insulin aspart (NOVOLOG) 100 UNIT/ML injection INJECT SUBCUTANEOUSLY VIA INSULIN PUMP UP TO 90 UNITS/DAY     levonorgestrel (MIRENA) 20 MCG/DAY IUD by Intrauterine route.     nystatin (MYCOSTATIN) 100000 UNIT/ML suspension Swish and spit 40ml four times daily. 100 mL 1   valACYclovir (VALTREX) 500 MG tablet Take 1 tablet (500 mg total) by mouth daily. Can increase  to twice a day for 5 days in the event of a recurrence 90 tablet 3   Blood Glucose Monitoring Suppl (CONTOUR NEXT MONITOR) w/Device KIT  (Patient not taking: Reported on 05/16/2021)     insulin lispro (HUMALOG) 100 UNIT/ML injection Use upto 90 units per day (Patient not taking: Reported on 05/16/2021)     Lancets (ONETOUCH ULTRASOFT) lancets Use. (Patient not taking: Reported on 05/16/2021)     No current facility-administered medications for this visit.     PHYSICAL EXAMINATION: ECOG PERFORMANCE STATUS: 0 - Asymptomatic Vitals:   05/16/21 1216  BP: (!) 132/91  Pulse: 88  Resp: 18  Temp: 100 F (37.8 C)  SpO2: 99%   Filed Weights   05/16/21 1216  Weight: 219 lb 3.2 oz (99.4 kg)    Physical Exam Constitutional:      General: She is not in acute distress. HENT:     Head: Normocephalic and atraumatic.  Eyes:     General: No scleral icterus. Cardiovascular:     Rate and Rhythm: Normal rate and regular rhythm.     Heart sounds: Normal heart sounds.  Pulmonary:     Effort: Pulmonary effort is normal. No respiratory distress.     Breath sounds: No wheezing.  Abdominal:     General: Bowel sounds are normal. There is no distension.     Palpations: Abdomen is soft.  Musculoskeletal:        General: No deformity. Normal range of motion.     Cervical back: Normal range of motion and neck supple.  Skin:    General: Skin is warm and dry.     Findings: No erythema or rash.  Neurological:     Mental Status: She is alert and oriented to person, place, and time. Mental status is at baseline.     Cranial Nerves: No cranial nerve deficit.     Coordination: Coordination normal.  Psychiatric:        Mood and Affect: Mood normal.    LABORATORY DATA:  I have reviewed the data as listed Lab Results  Component Value Date   WBC 6.8 05/16/2021   HGB 8.1 (L) 05/16/2021   HCT 27.3 (L) 05/16/2021   MCV 58.2 (L) 05/16/2021   PLT 376 05/16/2021   Recent Labs    05/07/21 1349  NA 135   K 4.4  CL 101  CO2 20  GLUCOSE 275*  BUN 16  CREATININE 1.13*  CALCIUM 9.0  PROT 7.1  ALBUMIN 4.0  AST 24  ALT 22  ALKPHOS 66  BILITOT <0.2   Iron/TIBC/Ferritin/ %Sat    Component Value Date/Time   IRON 14 (L) 05/16/2021 1246   IRON 10 (L) 05/07/2021 1349   TIBC 458 (H) 05/16/2021 1246   TIBC 366 05/07/2021 1349   FERRITIN 5 (  L) 05/16/2021 1246   IRONPCTSAT 3 (L) 05/16/2021 1246   IRONPCTSAT 3 (LL) 05/07/2021 1349      RADIOGRAPHIC STUDIES: I have personally reviewed the radiological images as listed and agreed with the findings in the report. No results found.    ASSESSMENT & PLAN:  1. Iron deficiency anemia, unspecified iron deficiency anemia type   2. RBC microcytosis    # Iron deficiency anemia.  Labs are reviewed and discussed with patient. Check cbc iron tibc ferritin. LDH, smear.  She has been on otc oral iron supplementation which causes constipation.  Options of continue oral iron vs IV iron were discussed with patient.  Plan IV iron with Venofer 210m weekly x 4 doses. Allergy reactions/infusion reaction including anaphylactic reaction discussed with patient. Other side effects include but not limited to high blood pressure, skin rash, weight gain, leg swelling, etc. Patient voices understanding and willing to proceed.   Chronic RBC microcytosis, Sickle cell trait, will check hemoglobinopathy evaluation.   Orders Placed This Encounter  Procedures   Hgb Fractionation Cascade    Standing Status:   Future    Number of Occurrences:   1    Standing Expiration Date:   05/16/2022   CBC with Differential/Platelet    Standing Status:   Future    Number of Occurrences:   1    Standing Expiration Date:   05/16/2022   Technologist smear review    Standing Status:   Future    Number of Occurrences:   1    Standing Expiration Date:   05/16/2022   Ferritin    Standing Status:   Future    Number of Occurrences:   1    Standing Expiration Date:   11/16/2021    Iron and TIBC    Standing Status:   Future    Number of Occurrences:   1    Standing Expiration Date:   05/16/2022   Retic Panel    Standing Status:   Future    Number of Occurrences:   1    Standing Expiration Date:   05/16/2022   Lactate dehydrogenase    Standing Status:   Future    Number of Occurrences:   1    Standing Expiration Date:   05/16/2022    All questions were answered. The patient knows to call the clinic with any problems questions or concerns.  cc PApolonio Schneiders FNP    Return of visit: IV venofer weekly x 4, follow up in 3 months, lab virtual MD +/- Venofer Thank you for this kind referral and the opportunity to participate in the care of this patient. A copy of today's note is routed to referring provider    ZEarlie Server MD, PhD Hematology Oncology CBlueridge Vista Health And Wellnessat AField Memorial Community HospitalPager- [phone]/15/2022

## 2021-05-16 NOTE — Addendum Note (Signed)
Addended by: Rickard Patience on: 05/16/2021 09:10 PM   Modules accepted: Orders

## 2021-05-19 ENCOUNTER — Telehealth: Payer: Self-pay

## 2021-05-19 NOTE — Telephone Encounter (Signed)
-----   Message from Rickard Patience, MD sent at 05/16/2021  9:09 PM EDT ----- Please schedule her to do IV venofer weekly x 4.   Follow up lab prior virtual MD + venofer in about 3 months.

## 2021-05-19 NOTE — Telephone Encounter (Signed)
Please schedule as MD recommends, *new* Venofer.

## 2021-05-20 ENCOUNTER — Encounter: Payer: Self-pay | Admitting: Oncology

## 2021-05-20 LAB — HGB FRACTIONATION CASCADE
Hgb A2: 2.9 % (ref 1.8–3.2)
Hgb A: 63.3 % — ABNORMAL LOW (ref 96.4–98.8)
Hgb F: 0 % (ref 0.0–2.0)
Hgb S: 33.8 % — ABNORMAL HIGH

## 2021-05-20 LAB — HGB SOLUBILITY: Hgb Solubility: POSITIVE — AB

## 2021-05-20 NOTE — Telephone Encounter (Signed)
Left VM for patient return call for scheduler per MD recommendations.

## 2021-05-20 NOTE — Telephone Encounter (Signed)
Mychart message sent as well, asking pt to call and schedule appts.

## 2021-05-20 NOTE — Telephone Encounter (Signed)
Patricia Ramirez please schedule pt as requested by MD and notify pt of appt.

## 2021-05-23 ENCOUNTER — Inpatient Hospital Stay: Payer: BC Managed Care – PPO

## 2021-05-23 ENCOUNTER — Other Ambulatory Visit: Payer: Self-pay | Admitting: Oncology

## 2021-05-23 MED ORDER — POLYSACCHARIDE IRON COMPLEX 150 MG PO CAPS: 150.0000 mg | ORAL_CAPSULE | Freq: Every day | ORAL | 2 refills | Status: DC

## 2021-05-23 NOTE — Progress Notes (Addendum)
Pt arrived at clinic for first time venofer infusion. VSS on arrival (see flowsheets). This RN attempted a PIV start at pts R A/C. After the Piv attempt, pt reported feeling dizzy and everything "going black". The pt's eyes then went blank and pt did not respond when this RN attempting to speak to her. The pt then exhibiting what appeared to be involuntary movements of the head and arms for several seconds. 2 episodes of this happened. Pt reported not remembering this after the episodes were over. Dr B came over and assessed pt. VSS (see flowsheet data) although, BP was lower than it was upon arrival to clinic. Pt observed for 15 min per Dr B and iron will be held today. Pt to follow up with Dr Cathie Hoops. This RN notified Dr Cathie Hoops and Lanora Manis RN via secure chat that pt will need to be scheduled for a MD visit before next infusion and advised pt to check her my chart.

## 2021-05-26 ENCOUNTER — Telehealth: Payer: Self-pay

## 2021-05-26 ENCOUNTER — Encounter: Payer: Self-pay | Admitting: Nurse Practitioner

## 2021-05-26 ENCOUNTER — Telehealth: Payer: Self-pay | Admitting: Oncology

## 2021-05-26 DIAGNOSIS — B370 Candidal stomatitis: Secondary | ICD-10-CM

## 2021-05-26 DIAGNOSIS — R569 Unspecified convulsions: Secondary | ICD-10-CM

## 2021-05-26 MED ORDER — FLUCONAZOLE 150 MG PO TABS: 150.0000 mg | ORAL_TABLET | Freq: Once | ORAL | 0 refills | Status: AC

## 2021-05-26 NOTE — Telephone Encounter (Signed)
-----   Message from Rickard Patience, MD sent at 05/23/2021  5:45 PM EDT ----- Please let her know that I am concerned that she may have seizure.  Please obtain CT head wo contrast- ASAP- stat if no spot within 1 week.  I recommend her to get neurology evaluation for seizure like activity. Please send referral.  If she has additional episodes, go to ER for evaluation.  Recommend her to try oral iron supplementation for now. Cancel her Venofer treatment for now.  I will send her a Rx thanks.  Keep same appt for now. Does she have PCP, please provide list of PCP info and ask her to establish care with PCP. Thank you  Janyth Contes

## 2021-05-26 NOTE — Telephone Encounter (Signed)
Spoke to patient and gave her MD recommendations. She verbalized understanding. Pt states that she does not have PCP but works in [place] and seen and NP there where she has any problems. Referral for Neurology sent to [facility] neuro.   Please schedule CT head and notify pt of appt.

## 2021-05-26 NOTE — Telephone Encounter (Signed)
Pt called to cancel the rest of her Iron infusions. Stated she had a reaction and was told she didn't need to come in anymore for the treatments. I have canceled the infusions per her request.

## 2021-05-26 NOTE — Telephone Encounter (Signed)
CT scan scheduled for 7/26 at 1230pm.  Patient notified and confirmed.

## 2021-05-27 ENCOUNTER — Other Ambulatory Visit: Payer: Self-pay

## 2021-05-27 ENCOUNTER — Ambulatory Visit
Admission: RE | Admit: 2021-05-27 | Discharge: 2021-05-27 | Disposition: A | Discharge status: Home / Self Care | Payer: BC Managed Care – PPO | Source: Ambulatory Visit | Attending: Oncology | Admitting: Oncology

## 2021-05-27 DIAGNOSIS — R569 Unspecified convulsions: Secondary | ICD-10-CM | POA: Diagnosis not present

## 2021-05-30 ENCOUNTER — Inpatient Hospital Stay: Payer: BC Managed Care – PPO

## 2021-05-31 LAB — IRON AND TIBC
Iron Saturation: 3 % — CL (ref 15–55)
Iron: 10 ug/dL — ABNORMAL LOW (ref 27–159)
Total Iron Binding Capacity: 366 ug/dL (ref 250–450)
UIBC: 356 ug/dL (ref 131–425)

## 2021-05-31 LAB — SPECIMEN STATUS REPORT

## 2021-06-02 NOTE — Telephone Encounter (Signed)
Referred to hem/onc

## 2021-06-06 ENCOUNTER — Ambulatory Visit: Payer: BC Managed Care – PPO

## 2021-06-13 ENCOUNTER — Ambulatory Visit: Payer: BC Managed Care – PPO

## 2021-06-13 ENCOUNTER — Other Ambulatory Visit: Payer: Self-pay | Admitting: Neurology

## 2021-06-13 DIAGNOSIS — R569 Unspecified convulsions: Secondary | ICD-10-CM | POA: Diagnosis not present

## 2021-06-13 DIAGNOSIS — R55 Syncope and collapse: Secondary | ICD-10-CM | POA: Diagnosis not present

## 2021-06-13 DIAGNOSIS — Z8669 Personal history of other diseases of the nervous system and sense organs: Secondary | ICD-10-CM | POA: Diagnosis not present

## 2021-06-13 DIAGNOSIS — D509 Iron deficiency anemia, unspecified: Secondary | ICD-10-CM | POA: Diagnosis not present

## 2021-06-24 ENCOUNTER — Ambulatory Visit
Admission: RE | Admit: 2021-06-24 | Discharge: 2021-06-24 | Disposition: A | Discharge status: Home / Self Care | Payer: BC Managed Care – PPO | Source: Ambulatory Visit | Attending: Neurology | Admitting: Neurology

## 2021-06-24 DIAGNOSIS — R55 Syncope and collapse: Secondary | ICD-10-CM | POA: Insufficient documentation

## 2021-06-24 MED ORDER — GADOBUTROL 1 MMOL/ML IV SOLN
9.0000 mL | Freq: Once | INTRAVENOUS | Status: AC | PRN
  Administered 2021-06-24: 9 mL via INTRAVENOUS

## 2021-06-25 DIAGNOSIS — B379 Candidiasis, unspecified: Secondary | ICD-10-CM | POA: Diagnosis not present

## 2021-06-25 DIAGNOSIS — E109 Type 1 diabetes mellitus without complications: Secondary | ICD-10-CM | POA: Diagnosis not present

## 2021-07-14 DIAGNOSIS — Z23 Encounter for immunization: Secondary | ICD-10-CM | POA: Diagnosis not present

## 2021-07-21 DIAGNOSIS — Z23 Encounter for immunization: Secondary | ICD-10-CM | POA: Diagnosis not present

## 2021-08-12 DIAGNOSIS — E109 Type 1 diabetes mellitus without complications: Secondary | ICD-10-CM | POA: Diagnosis not present

## 2021-08-22 ENCOUNTER — Inpatient Hospital Stay: Payer: BC Managed Care – PPO | Attending: Oncology

## 2021-08-22 ENCOUNTER — Other Ambulatory Visit: Payer: Self-pay

## 2021-08-22 DIAGNOSIS — Z79899 Other long term (current) drug therapy: Secondary | ICD-10-CM | POA: Diagnosis not present

## 2021-08-22 DIAGNOSIS — Z794 Long term (current) use of insulin: Secondary | ICD-10-CM | POA: Insufficient documentation

## 2021-08-22 DIAGNOSIS — R718 Other abnormality of red blood cells: Secondary | ICD-10-CM

## 2021-08-22 DIAGNOSIS — D509 Iron deficiency anemia, unspecified: Secondary | ICD-10-CM | POA: Insufficient documentation

## 2021-08-22 DIAGNOSIS — Z793 Long term (current) use of hormonal contraceptives: Secondary | ICD-10-CM | POA: Diagnosis not present

## 2021-08-22 DIAGNOSIS — R569 Unspecified convulsions: Secondary | ICD-10-CM | POA: Insufficient documentation

## 2021-08-22 DIAGNOSIS — E109 Type 1 diabetes mellitus without complications: Secondary | ICD-10-CM | POA: Diagnosis not present

## 2021-08-22 LAB — CBC WITH DIFFERENTIAL/PLATELET
Abs Immature Granulocytes: 0.02 10*3/uL (ref 0.00–0.07)
Basophils Absolute: 0.1 10*3/uL (ref 0.0–0.1)
Basophils Relative: 1 %
Eosinophils Absolute: 0.2 10*3/uL (ref 0.0–0.5)
Eosinophils Relative: 2 %
HCT: 27.5 % — ABNORMAL LOW (ref 36.0–46.0)
Hemoglobin: 8.1 g/dL — ABNORMAL LOW (ref 12.0–15.0)
Immature Granulocytes: 0 %
Lymphocytes Relative: 45 %
Lymphs Abs: 3.2 10*3/uL (ref 0.7–4.0)
MCH: 17.2 pg — ABNORMAL LOW (ref 26.0–34.0)
MCHC: 29.5 g/dL — ABNORMAL LOW (ref 30.0–36.0)
MCV: 58.3 fL — ABNORMAL LOW (ref 80.0–100.0)
Monocytes Absolute: 0.9 10*3/uL (ref 0.1–1.0)
Monocytes Relative: 13 %
Neutro Abs: 2.9 10*3/uL (ref 1.7–7.7)
Neutrophils Relative %: 39 %
Platelets: 415 10*3/uL — ABNORMAL HIGH (ref 150–400)
RBC: 4.72 MIL/uL (ref 3.87–5.11)
RDW: 19.4 % — ABNORMAL HIGH (ref 11.5–15.5)
WBC: 7.3 10*3/uL (ref 4.0–10.5)
nRBC: 0 % (ref 0.0–0.2)

## 2021-08-22 LAB — IRON AND TIBC
Iron: 14 ug/dL — ABNORMAL LOW (ref 28–170)
Saturation Ratios: 3 % — ABNORMAL LOW (ref 10.4–31.8)
TIBC: 447 ug/dL (ref 250–450)
UIBC: 433 ug/dL

## 2021-08-22 LAB — RETIC PANEL
Immature Retic Fract: 26.8 % — ABNORMAL HIGH (ref 2.3–15.9)
RBC.: 4.56 MIL/uL (ref 3.87–5.11)
Retic Count, Absolute: 43.3 10*3/uL (ref 19.0–186.0)
Retic Ct Pct: 1 % (ref 0.4–3.1)
Reticulocyte Hemoglobin: 19 pg — ABNORMAL LOW (ref >27.9)

## 2021-08-22 LAB — FERRITIN: Ferritin: 5 ng/mL — ABNORMAL LOW (ref 11–307)

## 2021-08-23 ENCOUNTER — Other Ambulatory Visit: Payer: Self-pay | Admitting: Oncology

## 2021-08-25 ENCOUNTER — Inpatient Hospital Stay (HOSPITAL_BASED_OUTPATIENT_CLINIC_OR_DEPARTMENT_OTHER): Payer: BC Managed Care – PPO | Admitting: Oncology

## 2021-08-25 ENCOUNTER — Encounter: Payer: Self-pay | Admitting: Oncology

## 2021-08-25 DIAGNOSIS — D509 Iron deficiency anemia, unspecified: Secondary | ICD-10-CM

## 2021-08-25 NOTE — Progress Notes (Signed)
Pt contacted for Mychart visit. No new concerns voiced.  

## 2021-08-25 NOTE — Progress Notes (Signed)
HEMATOLOGY-ONCOLOGY TeleHEALTH VISIT PROGRESS NOTE  I connected with Patricia Ramirez on 08/25/21  at  2:45 PM EDT by video enabled telemedicine visit and verified that I am speaking with the correct person using two identifiers. I discussed the limitations, risks, security and privacy concerns of performing an evaluation and management service by telemedicine and the availability of in-person appointments. The patient expressed understanding and agreed to proceed.   Other persons participating in the visit and their role in the encounter:  None  Patient's location: Home  Provider's location: office Chief Complaint: iron deficiency anemia.    INTERVAL HISTORY Patricia Ramirez is a 25 y.o. female who has above history reviewed by me today presents for follow up visit for iron deficiency anemia.   Prior to her first IV Venofer infusion, after IV was placed patient reported feeling dizzy and then passed out.  There was witnessed involuntary movements of the head and arms for several seconds, patient was evaluated by on-call physician. Patient was referred to neurology.  MRI brain was negative. Patient reports no additional episodes. Patient has been compliant taking oral iron supplementation.  Continues to have fatigue.  Review of Systems  Constitutional:  Positive for fatigue. Negative for appetite change, chills and fever.  HENT:   Negative for hearing loss and voice change.   Eyes:  Negative for eye problems.  Respiratory:  Negative for chest tightness and cough.   Cardiovascular:  Negative for chest pain.  Gastrointestinal:  Negative for abdominal distention, abdominal pain and blood in stool.  Endocrine: Negative for hot flashes.  Genitourinary:  Negative for difficulty urinating and frequency.   Musculoskeletal:  Negative for arthralgias.  Skin:  Negative for itching and rash.  Neurological:  Negative for extremity weakness.  Hematological:  Negative for adenopathy.   Psychiatric/Behavioral:  Negative for confusion.    Past Medical History:  Diagnosis Date   Anemia    Graves' disease in remission    Type 1 diabetes ([place])    Past Surgical History:  Procedure Laterality Date   COLONOSCOPY  2004   WISDOM TOOTH EXTRACTION  2019    Family History  Problem Relation Age of Onset   Hypertension Father     Social History   Socioeconomic History   Marital status: Married    Spouse name: Not on file   Number of children: Not on file   Years of education: Not on file   Highest education level: Not on file  Occupational History   Not on file  Tobacco Use   Smoking status: Never   Smokeless tobacco: Never  Vaping Use   Vaping Use: Never used  Substance and Sexual Activity   Alcohol use: Yes    Comment: occasionaly   Drug use: Never   Sexual activity: Yes    Birth control/protection: I.U.D.  Other Topics Concern   Not on file  Social History Narrative   ** Merged History Encounter **       Social Determinants of Health   Financial Resource Strain: Not on file  Food Insecurity: Not on file  Transportation Needs: Not on file  Physical Activity: Not on file  Stress: Not on file  Social Connections: Not on file  Intimate Partner Violence: Not on file    Current Outpatient Medications on File Prior to Visit  Medication Sig Dispense Refill   Cholecalciferol 50 MCG (2000 UT) TABS Take by mouth.     insulin aspart (NOVOLOG) 100 UNIT/ML injection INJECT SUBCUTANEOUSLY VIA INSULIN PUMP UP  TO 90 UNITS/DAY     iron polysaccharides (NIFEREX) 150 MG capsule Take 1 capsule (150 mg total) by mouth daily. 30 capsule 2   levonorgestrel (MIRENA) 20 MCG/DAY IUD by Intrauterine route.     valACYclovir (VALTREX) 500 MG tablet Take 1 tablet (500 mg total) by mouth daily. Can increase to twice a day for 5 days in the event of a recurrence 90 tablet 3   ALPRAZolam (XANAX) 0.25 MG tablet Take 0.25 mg by mouth at bedtime as needed for anxiety. (Patient not  taking: Reported on 08/25/2021)     Blood Glucose Monitoring Suppl (CONTOUR NEXT MONITOR) w/Device KIT  (Patient not taking: Reported on 08/25/2021)     CONTOUR NEXT TEST test strip  (Patient not taking: Reported on 08/25/2021)     ergocalciferol (VITAMIN D2) 1.25 MG (50000 UT) capsule Take 1 capsule by mouth once a week. (Patient not taking: Reported on 08/25/2021)     glucagon 1 MG injection Administer subcutaneously for severe hypoglycemia when other corrections have not been successful. (Patient not taking: Reported on 08/25/2021)     insulin lispro (HUMALOG) 100 UNIT/ML injection Use upto 90 units per day (Patient not taking: No sig reported)     Lancets (ONETOUCH ULTRASOFT) lancets Use. (Patient not taking: No sig reported)     nystatin (MYCOSTATIN) 100000 UNIT/ML suspension Swish and spit 52m four times daily. (Patient not taking: Reported on 08/25/2021) 100 mL 1   No current facility-administered medications on file prior to visit.    Allergies  Allergen Reactions   Tuberculin Tests Rash    2013/2014   Other Rash    TB SKIN TEST   Tuberculin Ppd Itching       Observations/Objective: Today's Vitals   08/25/21 1447  PainSc: 0-No pain   There is no height or weight on file to calculate BMI.  Physical Exam Neurological:     Mental Status: She is alert.    CBC    Component Value Date/Time   WBC 7.3 08/22/2021 1423   RBC 4.72 08/22/2021 1423   RBC 4.56 08/22/2021 1423   HGB 8.1 (L) 08/22/2021 1423   HGB 8.0 (L) 05/07/2021 1349   HCT 27.5 (L) 08/22/2021 1423   HCT 29.6 (L) 05/07/2021 1349   PLT 415 (H) 08/22/2021 1423   PLT 387 05/07/2021 1349   MCV 58.3 (L) 08/22/2021 1423   MCV 61 (L) 05/07/2021 1349   MCH 17.2 (L) 08/22/2021 1423   MCHC 29.5 (L) 08/22/2021 1423   RDW 19.4 (H) 08/22/2021 1423   RDW 19.8 (H) 05/07/2021 1349   LYMPHSABS 3.2 08/22/2021 1423   LYMPHSABS 2.6 05/07/2021 1349   MONOABS 0.9 08/22/2021 1423   EOSABS 0.2 08/22/2021 1423   EOSABS 0.1  05/07/2021 1349   BASOSABS 0.1 08/22/2021 1423   BASOSABS 0.0 05/07/2021 1349    CMP     Component Value Date/Time   NA 135 05/07/2021 1349   K 4.4 05/07/2021 1349   CL 101 05/07/2021 1349   CO2 20 05/07/2021 1349   GLUCOSE 275 (H) 05/07/2021 1349   BUN 16 05/07/2021 1349   CREATININE 1.13 (H) 05/07/2021 1349   CALCIUM 9.0 05/07/2021 1349   PROT 7.1 05/07/2021 1349   ALBUMIN 4.0 05/07/2021 1349   AST 24 05/07/2021 1349   ALT 22 05/07/2021 1349   ALKPHOS 66 05/07/2021 1349   BILITOT <0.2 05/07/2021 1349     Assessment and Plan: 1. Iron deficiency anemia, unspecified iron deficiency anemia type  Labs are reviewed and discussed with patient. Despite taking oral iron supplementation, hemoglobin remains low at 8.1.  Iron panel is consistent with severe iron deficiency. We discussed again about the option of IV Venofer treatments.  Patient agrees to have another child. We will schedule patient to have IV Venofer weekly x4. Patient has Mirena IUD.  Seizure-like activity, continue follow-up with neurology.  Follow Up Instructions: 3 months, labs MD virtually +/- Venofer.   I discussed the assessment and treatment plan with the patient. The patient was provided an opportunity to ask questions and all were answered. The patient agreed with the plan and demonstrated an understanding of the instructions.  The patient was advised to call back or seek an in-person evaluation if the symptoms worsen or if the condition fails to improve as anticipated.    Earlie Server, MD 08/25/2021 8:05 PM

## 2021-08-26 LAB — CELIAC PANEL 10
Antigliadin Abs, IgA: 4 units (ref 0–19)
Endomysial Ab, IgA: NEGATIVE
Gliadin IgG: 4 units (ref 0–19)
IgA: 259 mg/dL (ref 87–352)
Tissue Transglut Ab: <2 U/mL (ref 0–5)
Tissue Transglutaminase Ab, IgA: <2 U/mL (ref 0–3)

## 2021-09-01 ENCOUNTER — Other Ambulatory Visit: Payer: Self-pay

## 2021-09-01 ENCOUNTER — Inpatient Hospital Stay: Payer: BC Managed Care – PPO

## 2021-09-01 VITALS — BP 101/78 | HR 62 | Temp 98.6°F | Resp 18

## 2021-09-01 DIAGNOSIS — E109 Type 1 diabetes mellitus without complications: Secondary | ICD-10-CM | POA: Diagnosis not present

## 2021-09-01 DIAGNOSIS — Z794 Long term (current) use of insulin: Secondary | ICD-10-CM | POA: Diagnosis not present

## 2021-09-01 DIAGNOSIS — D509 Iron deficiency anemia, unspecified: Secondary | ICD-10-CM

## 2021-09-01 DIAGNOSIS — Z793 Long term (current) use of hormonal contraceptives: Secondary | ICD-10-CM | POA: Diagnosis not present

## 2021-09-01 DIAGNOSIS — Z79899 Other long term (current) drug therapy: Secondary | ICD-10-CM | POA: Diagnosis not present

## 2021-09-01 DIAGNOSIS — R569 Unspecified convulsions: Secondary | ICD-10-CM | POA: Diagnosis not present

## 2021-09-01 MED ORDER — IRON SUCROSE 20 MG/ML IV SOLN
200.0000 mg | Freq: Once | INTRAVENOUS | Status: AC
  Administered 2021-09-01: 200 mg via INTRAVENOUS

## 2021-09-01 MED ORDER — SODIUM CHLORIDE 0.9 % IV SOLN: 200.0000 mg | Freq: Once | INTRAVENOUS | Status: DC

## 2021-09-01 MED ORDER — SODIUM CHLORIDE 0.9 % IV SOLN
Freq: Once | INTRAVENOUS | Status: AC
  Filled 2021-09-01: qty 250

## 2021-09-01 NOTE — Patient Instructions (Signed)
[facility]  Discharge Instructions: Thank you for choosing [facility] to provide your oncology and hematology care.  If you have a lab appointment with the Cancer Center, please go directly to the Cancer Center and check in at the registration area.  Wear comfortable clothing and clothing appropriate for easy access to any Portacath or PICC line.   We strive to give you quality time with your provider. You may need to reschedule your appointment if you arrive late (15 or more minutes).  Arriving late affects you and other patients whose appointments are after yours.  Also, if you miss three or more appointments without notifying the office, you may be dismissed from the clinic at the provider's discretion.      For prescription refill requests, have your pharmacy contact our office and allow 72 hours for refills to be completed.    Today you received the following chemotherapy and/or immunotherapy agents VENOFER      To help prevent nausea and vomiting after your treatment, we encourage you to take your nausea medication as directed.  BELOW ARE SYMPTOMS THAT SHOULD BE REPORTED IMMEDIATELY: *FEVER GREATER THAN 100.4 F (38 C) OR HIGHER *CHILLS OR SWEATING *NAUSEA AND VOMITING THAT IS NOT CONTROLLED WITH YOUR NAUSEA MEDICATION *UNUSUAL SHORTNESS OF BREATH *UNUSUAL BRUISING OR BLEEDING *URINARY PROBLEMS (pain or burning when urinating, or frequent urination) *BOWEL PROBLEMS (unusual diarrhea, constipation, pain near the anus) TENDERNESS IN MOUTH AND THROAT WITH OR WITHOUT PRESENCE OF ULCERS (sore throat, sores in mouth, or a toothache) UNUSUAL RASH, SWELLING OR PAIN  UNUSUAL VAGINAL DISCHARGE OR ITCHING   Items with * indicate a potential emergency and should be followed up as soon as possible or go to the Emergency Department if any problems should occur.  Please show the CHEMOTHERAPY ALERT CARD or IMMUNOTHERAPY ALERT CARD at check-in to  the Emergency Department and triage nurse.  Should you have questions after your visit or need to cancel or reschedule your appointment, please contact [facility]  [phone] and follow the prompts.  Office hours are 8:00 a.m. to 4:30 p.m. Monday - Friday. Please note that voicemails left after 4:00 p.m. may not be returned until the following business day.  We are closed weekends and major holidays. You have access to a nurse at all times for urgent questions. Please call the main number to the clinic [phone] and follow the prompts.  For any non-urgent questions, you may also contact your provider using MyChart. We now offer e-Visits for anyone 18 and older to request care online for non-urgent symptoms. For details visit [email or web address].   Also download the MyChart app! Go to the app store, search "MyChart", open the app, select [facility], and log in with your MyChart username and password.  Due to Covid, a mask is required upon entering the hospital/clinic. If you do not have a mask, one will be given to you upon arrival. For doctor visits, patients may have 1 support person aged 18 or older with them. For treatment visits, patients cannot have anyone with them due to current Covid guidelines and our immunocompromised population.   Iron Sucrose Injection What is this medication? IRON SUCROSE (EYE ern SOO krose) treats low levels of iron (iron deficiency anemia) in people with kidney disease. Iron is a mineral that plays an important role in making red blood cells, which carry oxygen from your lungs to the rest of your body. This medicine may   be used for other purposes; ask your health care provider or pharmacist if you have questions. COMMON BRAND NAME(S): Venofer What should I tell my care team before I take this medication? They need to know if you have any of these conditions: Anemia not caused by low iron levels Heart disease High levels  of iron in the blood Kidney disease Liver disease An unusual or allergic reaction to iron, other medications, foods, dyes, or preservatives Pregnant or trying to get pregnant Breast-feeding How should I use this medication? This medication is for infusion into a vein. It is given in a hospital or clinic setting. Talk to your care team about the use of this medication in children. While this medication may be prescribed for children as young as 2 years for selected conditions, precautions do apply. Overdosage: If you think you have taken too much of this medicine contact a poison control center or emergency room at once. NOTE: This medicine is only for you. Do not share this medicine with others. What if I miss a dose? It is important not to miss your dose. Call your care team if you are unable to keep an appointment. What may interact with this medication? Do not take this medication with any of the following: Deferoxamine Dimercaprol Other iron products This medication may also interact with the following: Chloramphenicol Deferasirox This list may not describe all possible interactions. Give your health care provider a list of all the medicines, herbs, non-prescription drugs, or dietary supplements you use. Also tell them if you smoke, drink alcohol, or use illegal drugs. Some items may interact with your medicine. What should I watch for while using this medication? Visit your care team regularly. Tell your care team if your symptoms do not start to get better or if they get worse. You may need blood work done while you are taking this medication. You may need to follow a special diet. Talk to your care team. Foods that contain iron include: whole grains/cereals, dried fruits, beans, or peas, leafy green vegetables, and organ meats (liver, kidney). What side effects may I notice from receiving this medication? Side effects that you should report to your care team as soon as  possible: Allergic reactions-skin rash, itching, hives, swelling of the face, lips, tongue, or throat Low blood pressure-dizziness, feeling faint or lightheaded, blurry vision Shortness of breath Side effects that usually do not require medical attention (report to your care team if they continue or are bothersome): Flushing Headache Joint pain Muscle pain Nausea Pain, redness, or irritation at injection site This list may not describe all possible side effects. Call your doctor for medical advice about side effects. You may report side effects to FDA at 1-800-FDA-1088. Where should I keep my medication? This medication is given in a hospital or clinic and will not be stored at home. NOTE: This sheet is a summary. It may not cover all possible information. If you have questions about this medicine, talk to your doctor, pharmacist, or health care provider.  2022 Elsevier/Gold Standard (2021-01-14 12:52:06)  

## 2021-09-08 ENCOUNTER — Other Ambulatory Visit: Payer: Self-pay

## 2021-09-08 ENCOUNTER — Inpatient Hospital Stay: Payer: BC Managed Care – PPO | Attending: Oncology

## 2021-09-08 VITALS — BP 106/80 | HR 70 | Resp 18

## 2021-09-08 DIAGNOSIS — Z79899 Other long term (current) drug therapy: Secondary | ICD-10-CM | POA: Diagnosis not present

## 2021-09-08 DIAGNOSIS — Z793 Long term (current) use of hormonal contraceptives: Secondary | ICD-10-CM | POA: Diagnosis not present

## 2021-09-08 DIAGNOSIS — Z794 Long term (current) use of insulin: Secondary | ICD-10-CM | POA: Insufficient documentation

## 2021-09-08 DIAGNOSIS — R569 Unspecified convulsions: Secondary | ICD-10-CM | POA: Insufficient documentation

## 2021-09-08 DIAGNOSIS — D509 Iron deficiency anemia, unspecified: Secondary | ICD-10-CM | POA: Diagnosis not present

## 2021-09-08 DIAGNOSIS — E109 Type 1 diabetes mellitus without complications: Secondary | ICD-10-CM | POA: Diagnosis not present

## 2021-09-08 MED ORDER — SODIUM CHLORIDE 0.9 % IV SOLN
Freq: Once | INTRAVENOUS | Status: AC
  Filled 2021-09-08: qty 250

## 2021-09-08 MED ORDER — SODIUM CHLORIDE 0.9 % IV SOLN: 200.0000 mg | Freq: Once | INTRAVENOUS | Status: DC

## 2021-09-08 MED ORDER — IRON SUCROSE 20 MG/ML IV SOLN
200.0000 mg | Freq: Once | INTRAVENOUS | Status: AC
  Administered 2021-09-08: 200 mg via INTRAVENOUS
  Filled 2021-09-08: qty 10

## 2021-09-15 ENCOUNTER — Other Ambulatory Visit: Payer: Self-pay

## 2021-09-15 ENCOUNTER — Inpatient Hospital Stay: Payer: BC Managed Care – PPO

## 2021-09-15 VITALS — BP 130/81 | HR 80 | Temp 99.5°F | Resp 18

## 2021-09-15 DIAGNOSIS — Z794 Long term (current) use of insulin: Secondary | ICD-10-CM | POA: Diagnosis not present

## 2021-09-15 DIAGNOSIS — E109 Type 1 diabetes mellitus without complications: Secondary | ICD-10-CM | POA: Diagnosis not present

## 2021-09-15 DIAGNOSIS — Z793 Long term (current) use of hormonal contraceptives: Secondary | ICD-10-CM | POA: Diagnosis not present

## 2021-09-15 DIAGNOSIS — D509 Iron deficiency anemia, unspecified: Secondary | ICD-10-CM

## 2021-09-15 DIAGNOSIS — R569 Unspecified convulsions: Secondary | ICD-10-CM | POA: Diagnosis not present

## 2021-09-15 DIAGNOSIS — Z79899 Other long term (current) drug therapy: Secondary | ICD-10-CM | POA: Diagnosis not present

## 2021-09-15 MED ORDER — IRON SUCROSE 20 MG/ML IV SOLN
200.0000 mg | Freq: Once | INTRAVENOUS | Status: AC
  Administered 2021-09-15: 200 mg via INTRAVENOUS
  Filled 2021-09-15: qty 10

## 2021-09-15 MED ORDER — SODIUM CHLORIDE 0.9 % IV SOLN: 200.0000 mg | Freq: Once | INTRAVENOUS | Status: DC

## 2021-09-15 MED ORDER — SODIUM CHLORIDE 0.9 % IV SOLN
Freq: Once | INTRAVENOUS | Status: AC
Start: 2021-09-15 — End: 2021-09-15
  Filled 2021-09-15: qty 250

## 2021-09-19 ENCOUNTER — Other Ambulatory Visit: Payer: Self-pay

## 2021-09-19 DIAGNOSIS — E109 Type 1 diabetes mellitus without complications: Secondary | ICD-10-CM

## 2021-09-22 ENCOUNTER — Other Ambulatory Visit: Payer: Self-pay

## 2021-09-22 ENCOUNTER — Inpatient Hospital Stay: Payer: BC Managed Care – PPO

## 2021-09-22 ENCOUNTER — Other Ambulatory Visit: Payer: BC Managed Care – PPO

## 2021-09-22 VITALS — BP 131/90 | HR 84 | Temp 99.7°F | Resp 18

## 2021-09-22 DIAGNOSIS — D509 Iron deficiency anemia, unspecified: Secondary | ICD-10-CM | POA: Diagnosis not present

## 2021-09-22 DIAGNOSIS — Z79899 Other long term (current) drug therapy: Secondary | ICD-10-CM | POA: Diagnosis not present

## 2021-09-22 DIAGNOSIS — Z793 Long term (current) use of hormonal contraceptives: Secondary | ICD-10-CM | POA: Diagnosis not present

## 2021-09-22 DIAGNOSIS — E109 Type 1 diabetes mellitus without complications: Secondary | ICD-10-CM

## 2021-09-22 DIAGNOSIS — Z794 Long term (current) use of insulin: Secondary | ICD-10-CM | POA: Diagnosis not present

## 2021-09-22 DIAGNOSIS — R569 Unspecified convulsions: Secondary | ICD-10-CM | POA: Diagnosis not present

## 2021-09-22 MED ORDER — IRON SUCROSE 20 MG/ML IV SOLN
200.0000 mg | Freq: Once | INTRAVENOUS | Status: AC
  Administered 2021-09-22: 200 mg via INTRAVENOUS
  Filled 2021-09-22: qty 10

## 2021-09-22 MED ORDER — SODIUM CHLORIDE 0.9 % IV SOLN: 200.0000 mg | Freq: Once | INTRAVENOUS | Status: DC

## 2021-09-22 MED ORDER — SODIUM CHLORIDE 0.9 % IV SOLN
Freq: Once | INTRAVENOUS | Status: AC
Start: 2021-09-22 — End: 2021-09-22
  Filled 2021-09-22: qty 250

## 2021-09-22 NOTE — Patient Instructions (Signed)

## 2021-09-26 ENCOUNTER — Other Ambulatory Visit: Payer: Self-pay | Admitting: Obstetrics and Gynecology

## 2021-09-26 DIAGNOSIS — Z8619 Personal history of other infectious and parasitic diseases: Secondary | ICD-10-CM

## 2021-09-28 DIAGNOSIS — R350 Frequency of micturition: Secondary | ICD-10-CM | POA: Diagnosis not present

## 2021-09-28 DIAGNOSIS — E0500 Thyrotoxicosis with diffuse goiter without thyrotoxic crisis or storm: Secondary | ICD-10-CM | POA: Diagnosis not present

## 2021-09-28 DIAGNOSIS — E1065 Type 1 diabetes mellitus with hyperglycemia: Secondary | ICD-10-CM | POA: Diagnosis not present

## 2021-09-28 DIAGNOSIS — M549 Dorsalgia, unspecified: Secondary | ICD-10-CM | POA: Diagnosis not present

## 2021-09-28 DIAGNOSIS — R509 Fever, unspecified: Secondary | ICD-10-CM | POA: Diagnosis not present

## 2021-09-28 DIAGNOSIS — Z9641 Presence of insulin pump (external) (internal): Secondary | ICD-10-CM | POA: Diagnosis not present

## 2021-09-28 DIAGNOSIS — M48061 Spinal stenosis, lumbar region without neurogenic claudication: Secondary | ICD-10-CM | POA: Diagnosis not present

## 2021-09-28 DIAGNOSIS — M545 Low back pain, unspecified: Secondary | ICD-10-CM | POA: Diagnosis not present

## 2021-09-28 DIAGNOSIS — M5126 Other intervertebral disc displacement, lumbar region: Secondary | ICD-10-CM | POA: Diagnosis not present

## 2021-09-28 DIAGNOSIS — Z20822 Contact with and (suspected) exposure to covid-19: Secondary | ICD-10-CM | POA: Diagnosis not present

## 2021-09-29 DIAGNOSIS — E109 Type 1 diabetes mellitus without complications: Secondary | ICD-10-CM | POA: Diagnosis not present

## 2021-09-30 DIAGNOSIS — R509 Fever, unspecified: Secondary | ICD-10-CM | POA: Diagnosis not present

## 2021-10-02 ENCOUNTER — Ambulatory Visit: Payer: BC Managed Care – PPO | Admitting: Nurse Practitioner

## 2021-10-02 ENCOUNTER — Encounter: Payer: Self-pay | Admitting: Nurse Practitioner

## 2021-10-02 ENCOUNTER — Other Ambulatory Visit: Payer: Self-pay

## 2021-10-02 VITALS — BP 124/94 | HR 104 | Temp 99.4°F | Resp 18

## 2021-10-02 DIAGNOSIS — B370 Candidal stomatitis: Secondary | ICD-10-CM

## 2021-10-02 DIAGNOSIS — R509 Fever, unspecified: Secondary | ICD-10-CM

## 2021-10-02 LAB — POC COVID19 BINAXNOW: SARS Coronavirus 2 Ag: NEGATIVE

## 2021-10-02 MED ORDER — NYSTATIN 100000 UNIT/ML MT SUSP: 5.0000 mL | Freq: Four times a day (QID) | OROMUCOSAL | 2 refills | Status: DC

## 2021-10-02 NOTE — Progress Notes (Signed)
Subjective:    Patient ID: Patricia Ramirez, female    DOB: 10/25/1996, 25 y.o.   MRN: [id number]  HPI  25 year old female presenting to [facility] with complaints of nasal congestion and sore throat for the past 48 hours. 5 days ago she had acute onset of back pain and fever went to [facility] ED and had a full viral workup at that time. No white count at that time, respiratory panel returned negative, MRI of her back without abnormalities. Fever has persisted and was low grade this morning.   She is type 1 DM and has been monitoring her BG closely they have been elevated with illness She has been using an OTC decongestant with cough support for relief of symptoms  Back pain has resolved Denies urinary discomfort but she has been urinating more frequently.   Today's Vitals   10/02/21 1411  BP: (!) 124/94  Pulse: (!) 104  Resp: 18  Temp: 99.4 F (37.4 C)  TempSrc: Tympanic  SpO2: 98%   There is no height or weight on file to calculate BMI.   Review of Systems  Constitutional:  Positive for fatigue.  HENT:  Positive for congestion, ear pain, postnasal drip and sore throat.   Eyes: Negative.   Respiratory:  Positive for cough.   Cardiovascular: Negative.   Gastrointestinal: Negative.   Genitourinary:  Positive for frequency.  Musculoskeletal: Negative.   Neurological: Negative.       Objective:   Physical Exam HENT:     Head: Normocephalic.     Right Ear: Tympanic membrane and ear canal normal.     Left Ear: Tympanic membrane and ear canal normal.     Nose: Congestion present.     Mouth/Throat:     Mouth: Mucous membranes are moist.     Pharynx: Oropharynx is clear.      Comments: Thick white plaque to tongue as highlighted Cardiovascular:     Rate and Rhythm: Normal rate and regular rhythm.     Heart sounds: Normal heart sounds.  Pulmonary:     Effort: Pulmonary effort is normal.     Breath sounds: Normal breath sounds.  Musculoskeletal:     Cervical  back: Normal range of motion and neck supple.  Skin:    General: Skin is warm.  Neurological:     General: No focal deficit present.     Mental Status: She is alert.  Psychiatric:        Mood and Affect: Mood normal.    Recent Results (from the past 2160 hour(s))  Celiac panel 10     Status: None   Collection Time: 08/22/21  2:23 PM  Result Value Ref Range   Antigliadin Abs, IgA 4 0 - 19 units    Comment: (NOTE)                   Negative                   0 - 19                   Weak Positive             20 - 30                   Moderate to Strong Positive   >30    Gliadin IgG 4 0 - 19 units    Comment: (NOTE)  Negative                   0 - 19                   Weak Positive             20 - 30                   Moderate to Strong Positive   >30    Tissue Transglutaminase Ab, IgA <2 0 - 3 U/mL    Comment: (NOTE)                              Negative        0 -  3                              Weak Positive   4 - 10                              Positive           >10 Tissue Transglutaminase (tTG) has been identified as the endomysial antigen.  Studies have demonstr- ated that endomysial IgA antibodies have over 99% specificity for gluten sensitive enteropathy.    Tissue Transglut Ab <2 0 - 5 U/mL    Comment: (NOTE)                              Negative        0 - 5                              Weak Positive   6 - 9                              Positive           >9    Endomysial Ab, IgA Negative Negative   IgA 259 87 - 352 mg/dL    Comment: (NOTE) Performed At: [facility] [place], [place] [id number] Rush Farmer MD IR:[id number]   CBC with Differential/Platelet     Status: Abnormal   Collection Time: 08/22/21  2:23 PM  Result Value Ref Range   WBC 7.3 4.0 - 10.5 K/uL   RBC 4.72 3.87 - 5.11 MIL/uL   Hemoglobin 8.1 (L) 12.0 - 15.0 g/dL    Comment: Reticulocyte Hemoglobin testing may be clinically indicated, consider  ordering this additional test [id number]    HCT 27.5 (L) 36.0 - 46.0 %   MCV 58.3 (L) 80.0 - 100.0 fL   MCH 17.2 (L) 26.0 - 34.0 pg   MCHC 29.5 (L) 30.0 - 36.0 g/dL   RDW 19.4 (H) 11.5 - 15.5 %   Platelets 415 (H) 150 - 400 K/uL   nRBC 0.0 0.0 - 0.2 %   Neutrophils Relative % 39 %   Neutro Abs 2.9 1.7 - 7.7 K/uL   Lymphocytes Relative 45 %   Lymphs Abs 3.2 0.7 - 4.0 K/uL   Monocytes Relative 13 %   Monocytes Absolute 0.9 0.1 - 1.0 K/uL   Eosinophils Relative 2 %   Eosinophils Absolute 0.2  0.0 - 0.5 K/uL   Basophils Relative 1 %   Basophils Absolute 0.1 0.0 - 0.1 K/uL   Immature Granulocytes 0 %   Abs Immature Granulocytes 0.02 0.00 - 0.07 K/uL    Comment: Performed at [facility], [address] [place], [place] [address]  Retic Panel     Status: Abnormal   Collection Time: 08/22/21  2:23 PM  Result Value Ref Range   Retic Ct Pct 1.0 0.4 - 3.1 %   RBC. 4.56 3.87 - 5.11 MIL/uL   Retic Count, Absolute 43.3 19.0 - 186.0 K/uL   Immature Retic Fract 26.8 (H) 2.3 - 15.9 %   Reticulocyte Hemoglobin 19.0 (L) >27.9 pg    Comment:        A RET-He < 28 pg is an indication of iron-deficient or iron- insufficient erythropoiesis. Patients with thalassemia may also have a decreased RET-He result unrelated to iron availability.     If this patient has chronic kidney disease and does not have a hemoglobinopathy he/she meets criteria for iron deficiency per the 2016 NICE guidelines. Refer to specific guidelines to determine the appropriate thresholds for treating CKD- associated iron deficiency. TSAT and ferritin should be used in patients with hemoglobinopathies (e.g. thalassemia). Performed at [facility], [address] [place], [place] [address]   Ferritin     Status: Abnormal   Collection Time: 08/22/21  2:23 PM  Result Value Ref Range   Ferritin 5 (L) 11 - 307 ng/mL    Comment: Performed at [facility], [place], [place] [address]   Iron and TIBC     Status: Abnormal   Collection Time: 08/22/21  2:23 PM  Result Value Ref Range   Iron 14 (L) 28 - 170 ug/dL   TIBC 447 250 - 450 ug/dL   Saturation Ratios 3 (L) 10.4 - 31.8 %   UIBC 433 ug/dL    Comment: Performed at [facility], [address] [place], [place] [address]  POC COVID-19     Status: Normal   Collection Time: 10/02/21  2:45 PM  Result Value Ref Range   SARS Coronavirus 2 Ag Negative Negative         Assessment & Plan:  1. Oral thrush  - nystatin (MYCOSTATIN) 100000 UNIT/ML suspension; Take 5 mLs (500,000 Units total) by mouth 4 (four) times daily.  Dispense: 200 mL; Refill: 2 Continue for 1-2 weeks post symptom resolution  2. Fever, unspecified fever cause  - POC COVID-19 negative    Symptoms likely from URI possible flu day 5 now so deferred repeat testing.  Advised Mucinex and ibuprofen for relief Push fluids Tight management of glucose levels   Follow up if symptoms persist no assessed need for antibiotic at this time.

## 2021-10-03 ENCOUNTER — Other Ambulatory Visit: Payer: Self-pay | Admitting: Adult Health

## 2021-10-03 DIAGNOSIS — J019 Acute sinusitis, unspecified: Secondary | ICD-10-CM

## 2021-10-03 MED ORDER — AMOXICILLIN-POT CLAVULANATE 875-125 MG PO TABS
1.0000 | ORAL_TABLET | Freq: Two times a day (BID) | ORAL | 0 refills | Status: DC
Start: 2021-10-03 — End: 2022-04-17

## 2021-10-03 NOTE — Progress Notes (Signed)
Patient called, saw Evlyn Kanner NP yesterday.  She was told to call if her symptoms were not improving, for antibiotics.  Sent Augmentin according to S. Parker.   1. Acute sinusitis, recurrence not specified, unspecified location Take complete course of antibiotics as prescribed.  Take with food.  If symptoms fail to improve, or new/worse symptoms develop follow up in clinic.   - amoxicillin-clavulanate (AUGMENTIN) 875-125 MG tablet; Take 1 tablet by mouth 2 (two) times daily.  Dispense: 20 tablet; Refill: 0  Blima Ledger AGNP-C [employer]

## 2021-10-21 ENCOUNTER — Encounter: Payer: Self-pay | Admitting: Nurse Practitioner

## 2021-10-21 DIAGNOSIS — J40 Bronchitis, not specified as acute or chronic: Secondary | ICD-10-CM

## 2021-10-21 DIAGNOSIS — H1030 Unspecified acute conjunctivitis, unspecified eye: Secondary | ICD-10-CM | POA: Diagnosis not present

## 2021-10-22 ENCOUNTER — Telehealth: Payer: Self-pay | Admitting: Nurse Practitioner

## 2021-10-22 NOTE — Telephone Encounter (Signed)
Left message to schedule follow up in office

## 2021-10-23 ENCOUNTER — Encounter: Payer: Self-pay | Admitting: Nurse Practitioner

## 2021-10-23 ENCOUNTER — Other Ambulatory Visit: Payer: Self-pay

## 2021-10-23 ENCOUNTER — Encounter: Payer: BC Managed Care – PPO | Admitting: Obstetrics and Gynecology

## 2021-10-23 ENCOUNTER — Ambulatory Visit: Payer: BC Managed Care – PPO | Admitting: Nurse Practitioner

## 2021-10-23 VITALS — BP 118/88 | HR 106 | Temp 98.6°F | Resp 16

## 2021-10-23 DIAGNOSIS — B3731 Acute candidiasis of vulva and vagina: Secondary | ICD-10-CM

## 2021-10-23 DIAGNOSIS — J40 Bronchitis, not specified as acute or chronic: Secondary | ICD-10-CM

## 2021-10-23 MED ORDER — FLUCONAZOLE 150 MG PO TABS: 150.0000 mg | ORAL_TABLET | Freq: Once | ORAL | 0 refills | Status: AC

## 2021-10-23 MED ORDER — ALBUTEROL SULFATE HFA 108 (90 BASE) MCG/ACT IN AERS: 2.0000 | INHALATION_SPRAY | Freq: Four times a day (QID) | RESPIRATORY_TRACT | 0 refills | Status: DC | PRN

## 2021-10-23 MED ORDER — BENZONATATE 100 MG PO CAPS
100.0000 mg | ORAL_CAPSULE | Freq: Three times a day (TID) | ORAL | 0 refills | Status: AC | PRN
Start: 2021-10-23 — End: 2021-11-02

## 2021-10-23 MED ORDER — ALBUTEROL SULFATE (2.5 MG/3ML) 0.083% IN NEBU: 2.5000 mg | INHALATION_SOLUTION | Freq: Once | RESPIRATORY_TRACT | Status: DC

## 2021-10-23 NOTE — Progress Notes (Signed)
° °  Subjective:    Patient ID: Patricia Ramirez, female    DOB: 03/25/1996, 25 y.o.   MRN: [id number]  HPI  25 year old female returning to [facility] with complaints of recurrent sinus congestion.   She has been sick for the past month. Was seen in ED originally with negative viral workup-was then treated for a sinus infection started on antibiotics (Augmentin)10/03/21- improving symptoms while on abx and now has recurrent nasal congestion and cough.   She was treated for a conjunctivitis by eye doctor currently on antibiotic drops   She is coughing more and worse at night.  She is still using OTC cough medication for relief as well.   She has also been suffering from a vaginal yeast infection since finishing her Augmentin.   Today's Vitals   10/23/21 1104  BP: 118/88  Pulse: (!) 106  Resp: 16  Temp: 98.6 F (37 C)  TempSrc: Tympanic  SpO2: 99%   There is no height or weight on file to calculate BMI.        Objective:   Physical Exam Constitutional:      Appearance: Normal appearance.  HENT:     Head: Normocephalic.     Right Ear: Tympanic membrane, ear canal and external ear normal.     Left Ear: Tympanic membrane, ear canal and external ear normal.     Nose: Congestion present.     Mouth/Throat:     Mouth: Mucous membranes are moist.     Comments: Improving thrush apparent on tongue 20% coverage  Eyes:     Pupils: Pupils are equal, round, and reactive to light.  Cardiovascular:     Rate and Rhythm: Normal rate and regular rhythm.     Heart sounds: Normal heart sounds.  Pulmonary:     Effort: Pulmonary effort is normal.     Breath sounds: Examination of the right-lower field reveals decreased breath sounds. Examination of the left-lower field reveals decreased breath sounds. Decreased breath sounds present. No wheezing, rhonchi or rales.  Skin:    General: Skin is warm.     Capillary Refill: Capillary refill takes less than 2 seconds.  Neurological:      General: No focal deficit present.     Mental Status: She is alert.          Assessment & Plan:  1. Bronchitis  - albuterol (PROVENTIL) (2.5 MG/3ML) 0.083% nebulizer solution 2.5 mg Administered in house with improvement in symptoms and increased lung sounds to bases.   - albuterol (VENTOLIN HFA) 108 (90 Base) MCG/ACT inhaler; Inhale 2 puffs into the lungs every 6 (six) hours as needed for wheezing or shortness of breath.  Dispense: 8 g; Refill: 0 - benzonatate (TESSALON) 100 MG capsule; Take 1 capsule (100 mg total) by mouth 3 (three) times daily as needed for up to 10 days for cough.  Dispense: 30 capsule; Refill: 0  2. Vaginal yeast infection  - fluconazole (DIFLUCAN) 150 MG tablet; Take 1 tablet (150 mg total) by mouth once for 2 doses. May repeat after 72 hours as needed  Dispense: 2 tablet; Refill: 0    Continue Nystatin oral rinses to avoid Thrush from worsening.  May continue an OTC decongestant for nasal symptoms

## 2021-10-25 MED ORDER — AZITHROMYCIN 250 MG PO TABS: ORAL_TABLET | ORAL | 0 refills | Status: AC

## 2021-10-25 NOTE — Addendum Note (Signed)
Addended by: Viviano Simas E on: 10/25/2021 01:44 PM   Modules accepted: Orders

## 2021-11-18 ENCOUNTER — Other Ambulatory Visit: Payer: Self-pay | Admitting: *Deleted

## 2021-11-18 DIAGNOSIS — D509 Iron deficiency anemia, unspecified: Secondary | ICD-10-CM

## 2021-11-19 IMAGING — MR MR HEAD WO/W CM
16 series · 48 of 48 positions shown · IV contrast (gadavist)
Comparison: Head CT 05/27/2021

CLINICAL DATA: Episode of syncope 3 weeks ago. Family history of
epilepsy.

EXAM:
MRI HEAD WITHOUT AND WITH CONTRAST
TECHNIQUE: Multiplanar, multiecho pulse sequences of the brain and surrounding
structures were obtained without and with intravenous contrast.
CONTRAST:  9mL GADAVIST GADOBUTROL 1 MMOL/ML IV SOLN

[Series 5: ax dwi_tracew · axial · 3.0mm · 0.65mm/px · z∈[-72,+83]mm · 3 of 48 slices shown]
[im 1/48]
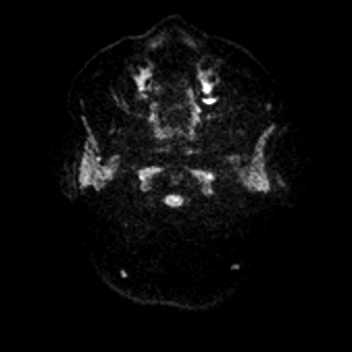
[im 24/48]
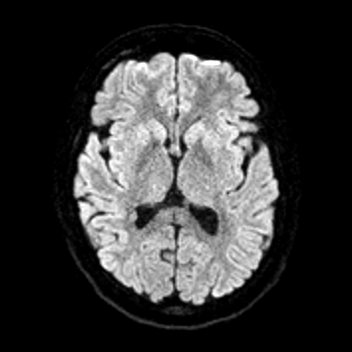
[im 48/48]
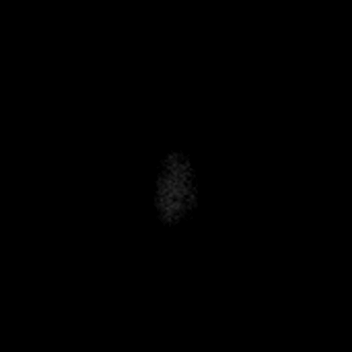

[Series 7: cor dwi_tracew · coronal · 5.0mm · 0.65mm/px · 2 of 40 slices shown]
[im 1/40]
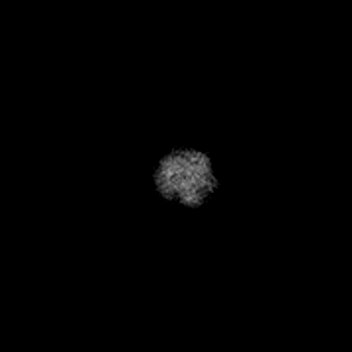
[im 40/40]
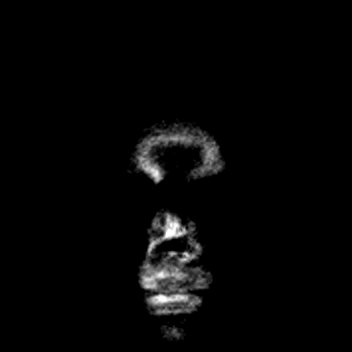

[Series 8: cor dwi_adc · coronal · 5.0mm · 0.65mm/px · 2 of 39 slices shown]
[im 1/39]
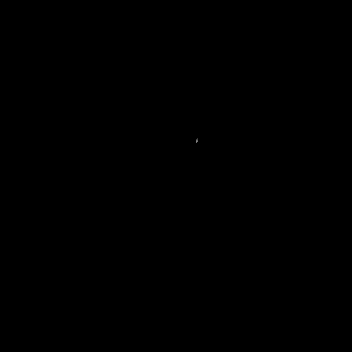
[im 39/39]
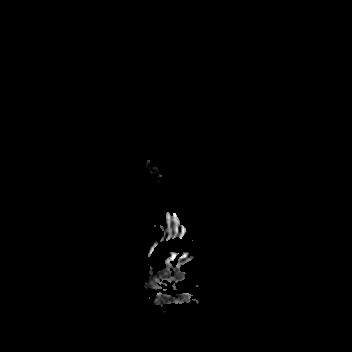

[Series 9: T1 · sagittal · 5.0mm · 0.62mm/px · 1 of 25 slices shown (1 of 2)]
[im 1/25]
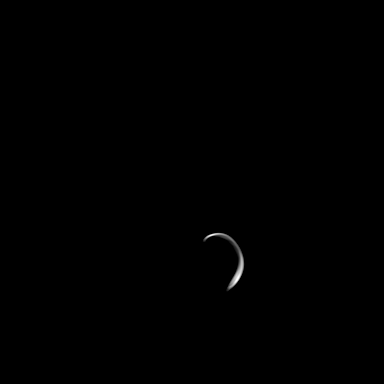

[Series 10: T2 · axial · 5.0mm · 0.53mm/px · 1 of 25 slices shown (1 of 2)]
[im 1/25]
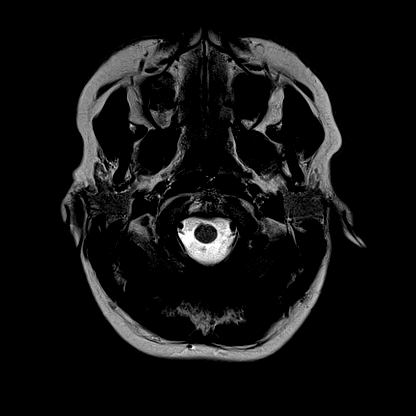

[Series 11: mag_images · axial · 3.0mm · 0.90mm/px · z∈[-83,+93]mm · 3 of 60 slices shown]
[im 1/60]
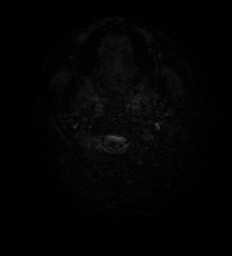
[im 30/60]
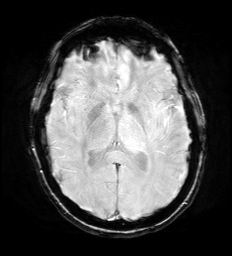
[im 60/60]
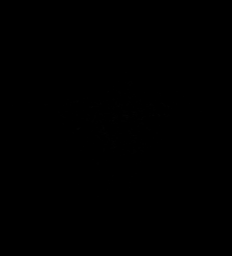

[Series 12: pha_images · axial · 3.0mm · 0.90mm/px · z∈[-83,+90]mm · 3 of 58 slices shown]
[im 1/58]
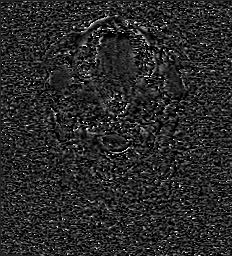
[im 29/58]
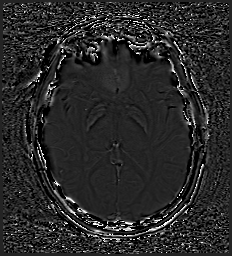
[im 58/58]
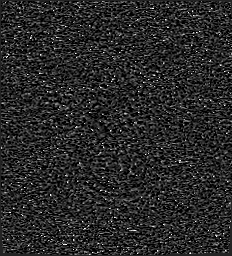

[Series 13: swi_images · axial · 3.0mm · 0.90mm/px · z∈[-83,+93]mm · 3 of 60 slices shown]
[im 1/60]
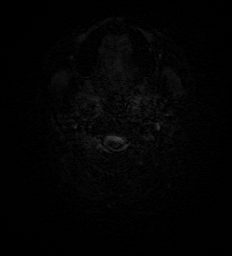
[im 30/60]
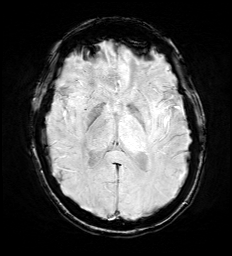
[im 60/60]
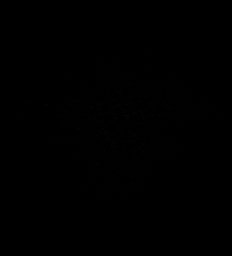

[Series 14: mip_images(sw) · axial · 24.0mm · 0.90mm/px · z∈[-73,+83]mm · 3 of 53 slices shown]
[im 1/53]
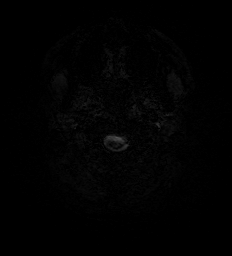
[im 27/53]
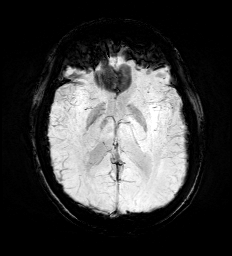
[im 53/53]
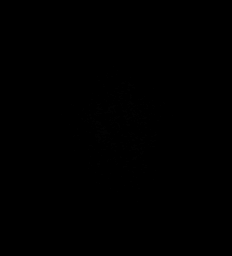

[Series 15: FLAIR · axial · 3.0mm · 0.53mm/px · z∈[-76,+86]mm · 3 of 55 slices shown (1 of 2)]
[im 1/55]
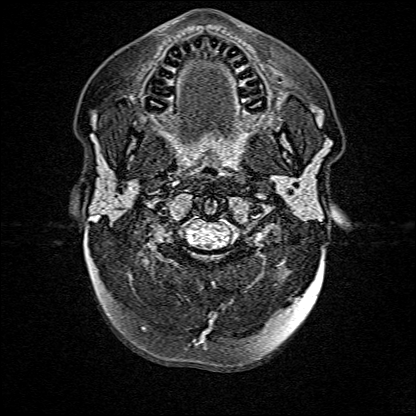
[im 28/55]
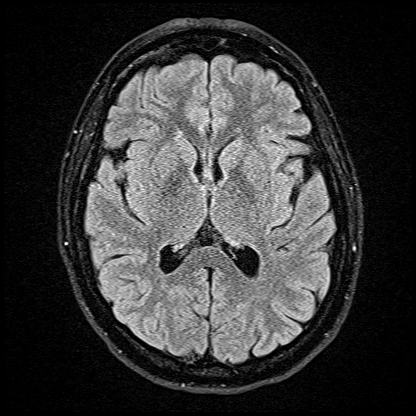
[im 55/55]
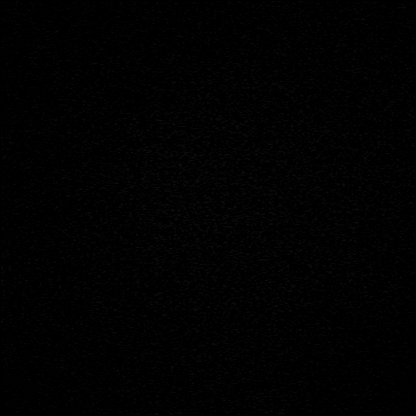

[Series 16: T1 · axial · 1.0mm · 0.98mm/px · z∈[-82,+93]mm · 9 of 175 slices shown (2 of 2)]
[im 1/175]
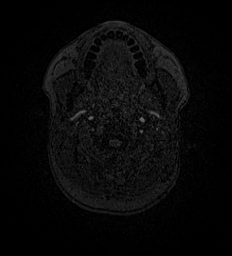
[im 22/175]
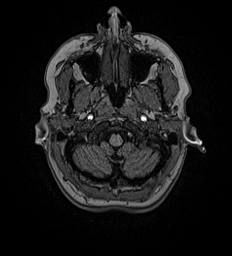
[im 44/175]
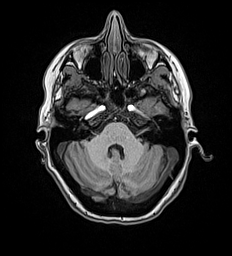
[im 66/175]
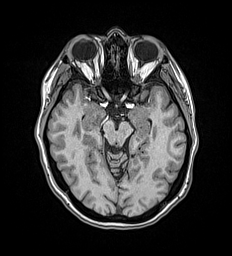
[im 88/175]
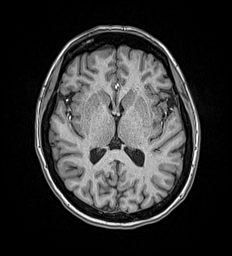
[im 109/175]
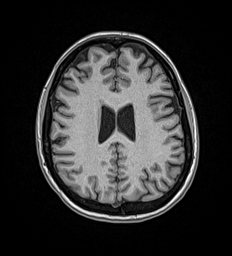
[im 131/175]
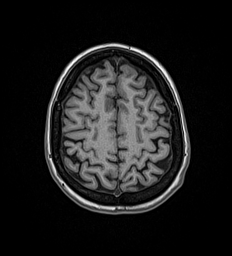
[im 153/175]
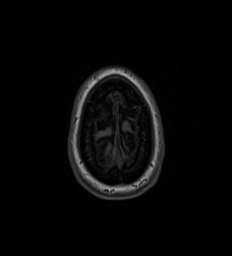
[im 175/175]
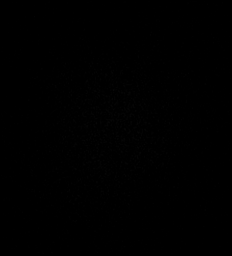

[Series 17: T2 · coronal · 3.0mm · 0.47mm/px · 2 of 35 slices shown (2 of 2)]
[im 1/35]
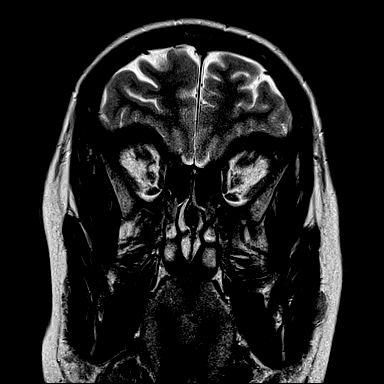
[im 35/35]
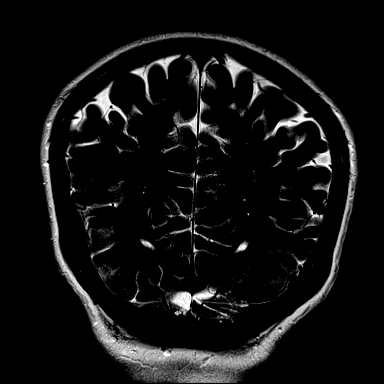

[Series 18: FLAIR · coronal · 3.0mm · 0.47mm/px · 2 of 35 slices shown (2 of 2)]
[im 1/35]
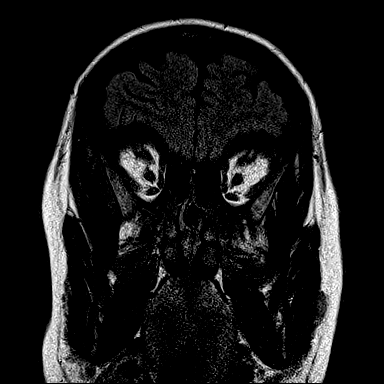
[im 35/35]
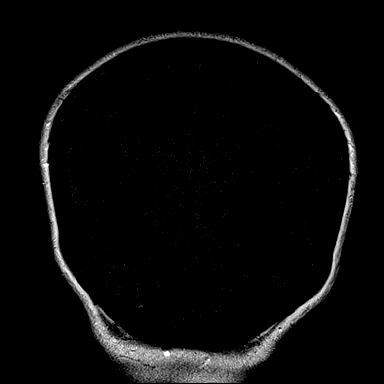

[Series 19: T2 post-contrast · coronal · 5.0mm · 0.57mm/px · 1 of 29 slices shown]
[im 1/29]
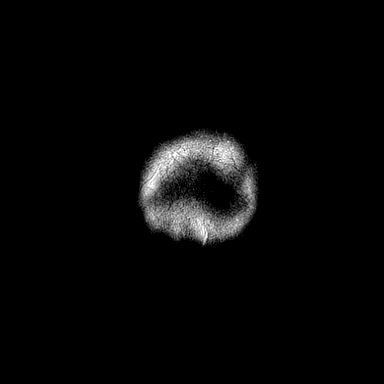

[Series 20: T1 post-contrast · axial · 1.0mm · 0.98mm/px · z∈[-82,+93]mm · 9 of 175 slices shown (1 of 2)]
[im 1/175]
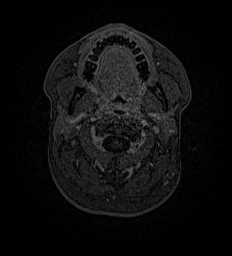
[im 22/175]
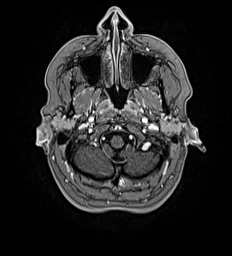
[im 44/175]
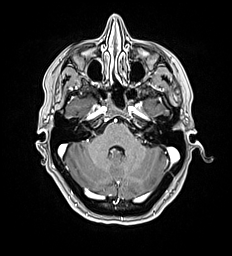
[im 66/175]
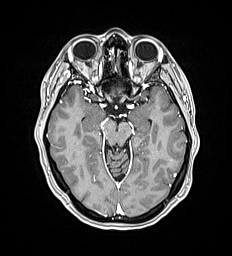
[im 88/175]
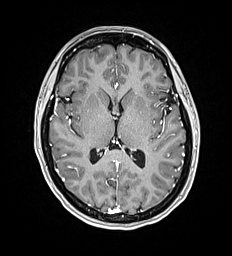
[im 109/175]
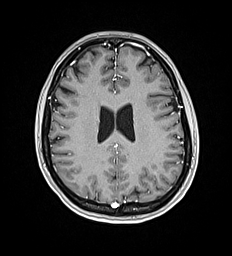
[im 131/175]
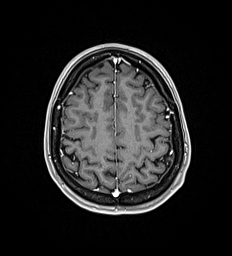
[im 153/175]
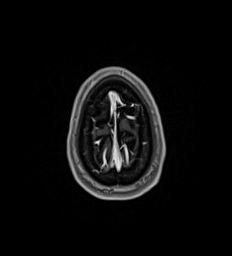
[im 175/175]
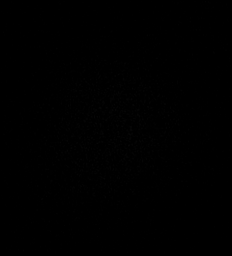

[Series 21: T1 post-contrast · coronal · 5.0mm · 0.57mm/px · 1 of 29 slices shown (2 of 2)]
[im 1/29]
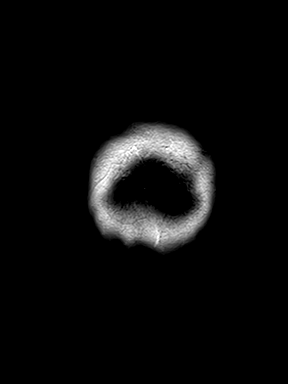

[48 of 48 positions shown; findings below may reference images not displayed]

FINDINGS: Brain: The brain has a normal appearance without evidence of
malformation, atrophy, old or acute small or large vessel
infarction, mass lesion, hemorrhage, hydrocephalus or extra-axial
collection. After contrast administration, no abnormal enhancement
occurs. Mesial temporal lobes appear symmetric and normal.

Vascular: Major vessels at the base of the brain show flow. Venous
sinuses appear patent.

Skull and upper cervical spine: Normal.

Sinuses/Orbits: Clear/normal.

Other: None significant.
IMPRESSION: Normal examination. No abnormality seen to explain syncopal episode
or which would predispose to seizure.

## 2021-11-20 ENCOUNTER — Telehealth: Payer: Self-pay | Admitting: Oncology

## 2021-11-20 NOTE — Telephone Encounter (Signed)
Pt has lab appt on 1-23 but she can't make it. Wants to know if she could come in today or tomorrow to do her labs. Call back at [phone]

## 2021-11-20 NOTE — Telephone Encounter (Signed)
Aby next available please. Let her know

## 2021-11-21 ENCOUNTER — Inpatient Hospital Stay: Payer: BC Managed Care – PPO | Attending: Oncology

## 2021-11-21 ENCOUNTER — Other Ambulatory Visit: Payer: Self-pay

## 2021-11-21 DIAGNOSIS — Z794 Long term (current) use of insulin: Secondary | ICD-10-CM | POA: Insufficient documentation

## 2021-11-21 DIAGNOSIS — E109 Type 1 diabetes mellitus without complications: Secondary | ICD-10-CM | POA: Diagnosis not present

## 2021-11-21 DIAGNOSIS — D573 Sickle-cell trait: Secondary | ICD-10-CM | POA: Insufficient documentation

## 2021-11-21 DIAGNOSIS — Z79899 Other long term (current) drug therapy: Secondary | ICD-10-CM | POA: Insufficient documentation

## 2021-11-21 DIAGNOSIS — Z9641 Presence of insulin pump (external) (internal): Secondary | ICD-10-CM | POA: Diagnosis not present

## 2021-11-21 DIAGNOSIS — D509 Iron deficiency anemia, unspecified: Secondary | ICD-10-CM | POA: Diagnosis not present

## 2021-11-21 DIAGNOSIS — Z793 Long term (current) use of hormonal contraceptives: Secondary | ICD-10-CM | POA: Insufficient documentation

## 2021-11-21 LAB — CBC WITH DIFFERENTIAL/PLATELET
Abs Immature Granulocytes: 0.01 10*3/uL (ref 0.00–0.07)
Basophils Absolute: 0 10*3/uL (ref 0.0–0.1)
Basophils Relative: 1 %
Eosinophils Absolute: 0.1 10*3/uL (ref 0.0–0.5)
Eosinophils Relative: 2 %
HCT: 35.3 % — ABNORMAL LOW (ref 36.0–46.0)
Hemoglobin: 12.1 g/dL (ref 12.0–15.0)
Immature Granulocytes: 0 %
Lymphocytes Relative: 35 %
Lymphs Abs: 2.4 10*3/uL (ref 0.7–4.0)
MCH: 25.3 pg — ABNORMAL LOW (ref 26.0–34.0)
MCHC: 34.3 g/dL (ref 30.0–36.0)
MCV: 73.7 fL — ABNORMAL LOW (ref 80.0–100.0)
Monocytes Absolute: 0.8 10*3/uL (ref 0.1–1.0)
Monocytes Relative: 12 %
Neutro Abs: 3.4 10*3/uL (ref 1.7–7.7)
Neutrophils Relative %: 50 %
Platelets: 296 10*3/uL (ref 150–400)
RBC: 4.79 MIL/uL (ref 3.87–5.11)
RDW: 22.3 % — ABNORMAL HIGH (ref 11.5–15.5)
WBC: 6.8 10*3/uL (ref 4.0–10.5)
nRBC: 0 % (ref 0.0–0.2)

## 2021-11-21 LAB — IRON AND TIBC
Iron: 28 ug/dL (ref 28–170)
Saturation Ratios: 9 % — ABNORMAL LOW (ref 10.4–31.8)
TIBC: 316 ug/dL (ref 250–450)
UIBC: 288 ug/dL

## 2021-11-21 LAB — FERRITIN: Ferritin: 49 ng/mL (ref 11–307)

## 2021-11-24 ENCOUNTER — Inpatient Hospital Stay: Payer: BC Managed Care – PPO

## 2021-11-25 ENCOUNTER — Inpatient Hospital Stay (HOSPITAL_BASED_OUTPATIENT_CLINIC_OR_DEPARTMENT_OTHER): Payer: BC Managed Care – PPO | Admitting: Oncology

## 2021-11-25 DIAGNOSIS — D573 Sickle-cell trait: Secondary | ICD-10-CM | POA: Insufficient documentation

## 2021-11-25 DIAGNOSIS — R569 Unspecified convulsions: Secondary | ICD-10-CM | POA: Diagnosis not present

## 2021-11-25 DIAGNOSIS — D509 Iron deficiency anemia, unspecified: Secondary | ICD-10-CM | POA: Diagnosis not present

## 2021-11-25 NOTE — Progress Notes (Signed)
Patient states that her only concern today is what the next steps are in her treatment (continue oral iron/more infusions?). She recently got over bronchitis and eye infection.

## 2021-11-25 NOTE — Progress Notes (Signed)
HEMATOLOGY-ONCOLOGY TeleHEALTH VISIT PROGRESS NOTE  I connected with Patricia Ramirez on 11/25/21  at  2:45 PM EST by video enabled telemedicine visit and verified that I am speaking with the correct person using two identifiers. I discussed the limitations, risks, security and privacy concerns of performing an evaluation and management service by telemedicine and the availability of in-person appointments. The patient expressed understanding and agreed to proceed.   Other persons participating in the visit and their role in the encounter:  None  Patient's location: Home  Provider's location: office Chief Complaint: iron deficiency anemia.    INTERVAL HISTORY Patricia Ramirez is a 26 y.o. female who has above history reviewed by me today presents for follow up visit for iron deficiency anemia.   Patricia Ramirez is able to tolerate IV venofer treatments. Fatigue has improved.   Review of Systems  Constitutional:  Positive for fatigue. Negative for appetite change, chills and fever.  HENT:   Negative for hearing loss and voice change.   Eyes:  Negative for eye problems.  Respiratory:  Negative for chest tightness and cough.   Cardiovascular:  Negative for chest pain.  Gastrointestinal:  Negative for abdominal distention, abdominal pain and blood in stool.  Endocrine: Negative for hot flashes.  Genitourinary:  Negative for difficulty urinating and frequency.   Musculoskeletal:  Negative for arthralgias.  Skin:  Negative for itching and rash.  Neurological:  Negative for extremity weakness.  Hematological:  Negative for adenopathy.  Psychiatric/Behavioral:  Negative for confusion.    Past Medical History:  Diagnosis Date   Anemia    Graves' disease in remission    Type 1 diabetes ([place])    Past Surgical History:  Procedure Laterality Date   COLONOSCOPY  2004   WISDOM TOOTH EXTRACTION  2019    Family History  Problem Relation Age of Onset   Hypertension Father     Social History   Socioeconomic  History   Marital status: Married    Spouse name: Not on file   Number of children: Not on file   Years of education: Not on file   Highest education level: Not on file  Occupational History   Not on file  Tobacco Use   Smoking status: Never   Smokeless tobacco: Never  Vaping Use   Vaping Use: Never used  Substance and Sexual Activity   Alcohol use: Yes    Comment: occasionaly   Drug use: Never   Sexual activity: Yes    Birth control/protection: I.U.D.  Other Topics Concern   Not on file  Social History Narrative   ** Merged History Encounter **       Social Determinants of Health   Financial Resource Strain: Not on file  Food Insecurity: Not on file  Transportation Needs: Not on file  Physical Activity: Not on file  Stress: Not on file  Social Connections: Not on file  Intimate Partner Violence: Not on file    Current Outpatient Medications on File Prior to Visit  Medication Sig Dispense Refill   Cholecalciferol 50 MCG (2000 UT) TABS Take by mouth.     insulin aspart (NOVOLOG) 100 UNIT/ML injection INJECT SUBCUTANEOUSLY VIA INSULIN PUMP UP TO 90 UNITS/DAY     Insulin Disposable Pump (OMNIPOD 5 G6 INTRO, GEN 5,) KIT Inject into the skin.     levonorgestrel (MIRENA) 20 MCG/DAY IUD by Intrauterine route.     valACYclovir (VALTREX) 500 MG tablet TAKE 1 TABLET BY MOUTH DAILY. CAN INCREASE TO TWICE A DAY FOR  5 DAYS IN THE EVENT OF A RECURRENCE 90 tablet 0   ALPRAZolam (XANAX) 0.25 MG tablet Take 0.25 mg by mouth at bedtime as needed for anxiety. (Patient not taking: Reported on 08/25/2021)     amoxicillin-clavulanate (AUGMENTIN) 875-125 MG tablet Take 1 tablet by mouth 2 (two) times daily. (Patient not taking: Reported on 11/25/2021) 20 tablet 0   Blood Glucose Monitoring Suppl (CONTOUR NEXT MONITOR) w/Device KIT  (Patient not taking: Reported on 08/25/2021)     CONTOUR NEXT TEST test strip  (Patient not taking: Reported on 08/25/2021)     cyclobenzaprine (FLEXERIL) 10 MG  tablet Take 5 mg by mouth 3 (three) times daily as needed. (Patient not taking: Reported on 11/25/2021)     glucagon 1 MG injection Administer subcutaneously for severe hypoglycemia when other corrections have not been successful. (Patient not taking: Reported on 08/25/2021)     iron polysaccharides (NIFEREX) 150 MG capsule Take 1 capsule (150 mg total) by mouth daily. (Patient not taking: Reported on 11/25/2021) 30 capsule 2   Lancets (ONETOUCH ULTRASOFT) lancets Use. (Patient not taking: Reported on 05/16/2021)     neomycin-polymyxin b-dexamethasone (MAXITROL) 3.5-10000-0.1 SUSP SMARTSIG:2 Drop(s) In Eye(s) Every 6 Hours (Patient not taking: Reported on 11/25/2021)     nystatin (MYCOSTATIN) 100000 UNIT/ML suspension Take 5 mLs (500,000 Units total) by mouth 4 (four) times daily. (Patient not taking: Reported on 11/25/2021) 200 mL 2   ondansetron (ZOFRAN-ODT) 4 MG disintegrating tablet Take 4 mg by mouth every 8 (eight) hours as needed. (Patient not taking: Reported on 11/25/2021)     Current Facility-Administered Medications on File Prior to Visit  Medication Dose Route Frequency Provider Last Rate Last Admin   albuterol (PROVENTIL) (2.5 MG/3ML) 0.083% nebulizer solution 2.5 mg  2.5 mg Nebulization Once Apolonio Schneiders, FNP        Allergies  Allergen Reactions   Tuberculin Tests Rash    2013/2014   Other Rash    TB SKIN TEST   Tuberculin Ppd Itching       Observations/Objective: Today's Vitals   11/25/21 1038  PainSc: 0-No pain   There is no height or weight on file to calculate BMI.  Physical Exam Neurological:     Mental Status: Patricia Ramirez is alert.    CBC    Component Value Date/Time   WBC 6.8 11/21/2021 1453   RBC 4.79 11/21/2021 1453   HGB 12.1 11/21/2021 1453   HGB 8.0 (L) 05/07/2021 1349   HCT 35.3 (L) 11/21/2021 1453   HCT 29.6 (L) 05/07/2021 1349   PLT 296 11/21/2021 1453   PLT 387 05/07/2021 1349   MCV 73.7 (L) 11/21/2021 1453   MCV 61 (L) 05/07/2021 1349   MCH 25.3 (L)  11/21/2021 1453   MCHC 34.3 11/21/2021 1453   RDW 22.3 (H) 11/21/2021 1453   RDW 19.8 (H) 05/07/2021 1349   LYMPHSABS 2.4 11/21/2021 1453   LYMPHSABS 2.6 05/07/2021 1349   MONOABS 0.8 11/21/2021 1453   EOSABS 0.1 11/21/2021 1453   EOSABS 0.1 05/07/2021 1349   BASOSABS 0.0 11/21/2021 1453   BASOSABS 0.0 05/07/2021 1349    CMP     Component Value Date/Time   NA 135 05/07/2021 1349   K 4.4 05/07/2021 1349   CL 101 05/07/2021 1349   CO2 20 05/07/2021 1349   GLUCOSE 275 (H) 05/07/2021 1349   BUN 16 05/07/2021 1349   CREATININE 1.13 (H) 05/07/2021 1349   CALCIUM 9.0 05/07/2021 1349   PROT 7.1 05/07/2021 1349  ALBUMIN 4.0 05/07/2021 1349   AST 24 05/07/2021 1349   ALT 22 05/07/2021 1349   ALKPHOS 66 05/07/2021 1349   BILITOT <0.2 05/07/2021 1349     Assessment and Plan: 1. Iron deficiency anemia, unspecified iron deficiency anemia type   2. Seizure-like activity ([place])   3. Sickle cell trait ([place])     Labs are reviewed and discussed with patient. Improved hemoglobin to 12.  Iron panel still shows iron deficiency, decreased iron deficiency.  Will arrange her get one more Venofer 228m treatment.   Patient has Mirena IUD.  Sickle cell trait, observe.   Follow Up Instructions: 6 months, labs MD +/- Venofer.   I discussed the assessment and treatment plan with the patient. The patient was provided an opportunity to ask questions and all were answered. The patient agreed with the plan and demonstrated an understanding of the instructions.  The patient was advised to call back or seek an in-person evaluation if the symptoms worsen or if the condition fails to improve as anticipated.    ZEarlie Server MD 11/25/2021 10:58 PM

## 2021-11-27 ENCOUNTER — Other Ambulatory Visit (HOSPITAL_COMMUNITY)
Admission: RE | Admit: 2021-11-27 | Discharge: 2021-11-27 | Disposition: A | Discharge status: Home / Self Care | Payer: BC Managed Care – PPO | Source: Ambulatory Visit | Attending: Obstetrics and Gynecology | Admitting: Obstetrics and Gynecology

## 2021-11-27 ENCOUNTER — Encounter: Payer: Self-pay | Admitting: Obstetrics and Gynecology

## 2021-11-27 ENCOUNTER — Other Ambulatory Visit: Payer: Self-pay

## 2021-11-27 ENCOUNTER — Ambulatory Visit (INDEPENDENT_AMBULATORY_CARE_PROVIDER_SITE_OTHER): Payer: BC Managed Care – PPO | Admitting: Obstetrics and Gynecology

## 2021-11-27 VITALS — BP 127/87 | HR 88 | Resp 16 | Ht 64.5 in | Wt 230.9 lb

## 2021-11-27 DIAGNOSIS — Z124 Encounter for screening for malignant neoplasm of cervix: Secondary | ICD-10-CM | POA: Diagnosis not present

## 2021-11-27 DIAGNOSIS — Z01419 Encounter for gynecological examination (general) (routine) without abnormal findings: Secondary | ICD-10-CM | POA: Diagnosis not present

## 2021-11-27 DIAGNOSIS — E109 Type 1 diabetes mellitus without complications: Secondary | ICD-10-CM | POA: Diagnosis not present

## 2021-11-27 NOTE — Progress Notes (Signed)
HPI:      [name] is a 26 y.o. G0P0000 who LMP was No LMP recorded. (Menstrual status: IUD).  Subjective:   She presents today for her annual examination.  She has no GYN complaints.  She has an IUD (Mirena-2017) and she is not having menstrual periods.  She is happy to learn that it is an 8-year IUD. She has an insulin pump but she recently changed pumps.  The new algorithm on her pump is not controlling her sugars as well and she is trying to work out the kinks. Of significant note, patient was recently found to have a hemoglobin of 6.  She has been receiving iron infusions and is seeing hematology for further work-up.  She reports no obvious recent blood loss.    Hx: The following portions of the patient's history were reviewed and updated as appropriate:             She  has a past medical history of Anemia, Graves' disease in remission, and Type 1 diabetes ([place]). She does not have any pertinent problems on file. She  has a past surgical history that includes Colonoscopy (2004) and Wisdom tooth extraction (2019). Her family history includes Breast cancer in her maternal grandmother; Glaucoma in her maternal grandfather; Hypertension in her father and paternal grandfather; Prostate cancer in her maternal grandfather. She  reports that she has never smoked. She has never used smokeless tobacco. She reports current alcohol use. She reports that she does not use drugs. She has a current medication list which includes the following prescription(s): alprazolam, amoxicillin-clavulanate, contour next monitor, cholecalciferol, contour next test, cyclobenzaprine, glucagon, insulin aspart, omnipod 5 g6 intro (gen 5), iron polysaccharides, onetouch ultrasoft, levonorgestrel, neomycin-polymyxin b-dexamethasone, nystatin, ondansetron, and valacyclovir, and the following Facility-Administered Medications: albuterol. She is allergic to tuberculin tests, other, and tuberculin ppd.       Review of  Systems:  Review of Systems  Constitutional: Denied constitutional symptoms, night sweats, recent illness, fatigue, fever, insomnia and weight loss.  Eyes: Denied eye symptoms, eye pain, photophobia, vision change and visual disturbance.  Ears/Nose/Throat/Neck: Denied ear, nose, throat or neck symptoms, hearing loss, nasal discharge, sinus congestion and sore throat.  Cardiovascular: Denied cardiovascular symptoms, arrhythmia, chest pain/pressure, edema, exercise intolerance, orthopnea and palpitations.  Respiratory: Denied pulmonary symptoms, asthma, pleuritic pain, productive sputum, cough, dyspnea and wheezing.  Gastrointestinal: Denied, gastro-esophageal reflux, melena, nausea and vomiting.  Genitourinary: Denied genitourinary symptoms including symptomatic vaginal discharge, pelvic relaxation issues, and urinary complaints.  Musculoskeletal: Denied musculoskeletal symptoms, stiffness, swelling, muscle weakness and myalgia.  Dermatologic: Denied dermatology symptoms, rash and scar.  Neurologic: Denied neurology symptoms, dizziness, headache, neck pain and syncope.  Psychiatric: Denied psychiatric symptoms, anxiety and depression.  Endocrine: Denied endocrine symptoms including hot flashes and night sweats.   Meds:   Current Outpatient Medications on File Prior to Visit  Medication Sig Dispense Refill   ALPRAZolam (XANAX) 0.25 MG tablet Take 0.25 mg by mouth at bedtime as needed for anxiety. (Patient not taking: Reported on 08/25/2021)     amoxicillin-clavulanate (AUGMENTIN) 875-125 MG tablet Take 1 tablet by mouth 2 (two) times daily. (Patient not taking: Reported on 11/25/2021) 20 tablet 0   Blood Glucose Monitoring Suppl (CONTOUR NEXT MONITOR) w/Device KIT  (Patient not taking: Reported on 08/25/2021)     Cholecalciferol 50 MCG (2000 UT) TABS Take by mouth.     CONTOUR NEXT TEST test strip  (Patient not taking: Reported on 08/25/2021)     cyclobenzaprine (FLEXERIL)  10 MG tablet Take 5  mg by mouth 3 (three) times daily as needed. (Patient not taking: Reported on 11/25/2021)     glucagon 1 MG injection Administer subcutaneously for severe hypoglycemia when other corrections have not been successful. (Patient not taking: Reported on 08/25/2021)     insulin aspart (NOVOLOG) 100 UNIT/ML injection INJECT SUBCUTANEOUSLY VIA INSULIN PUMP UP TO 90 UNITS/DAY     Insulin Disposable Pump (OMNIPOD 5 G6 INTRO, GEN 5,) KIT Inject into the skin.     iron polysaccharides (NIFEREX) 150 MG capsule Take 1 capsule (150 mg total) by mouth daily. (Patient not taking: Reported on 11/25/2021) 30 capsule 2   Lancets (ONETOUCH ULTRASOFT) lancets Use. (Patient not taking: Reported on 05/16/2021)     levonorgestrel (MIRENA) 20 MCG/DAY IUD by Intrauterine route.     neomycin-polymyxin b-dexamethasone (MAXITROL) 3.5-10000-0.1 SUSP SMARTSIG:2 Drop(s) In Eye(s) Every 6 Hours (Patient not taking: Reported on 11/25/2021)     nystatin (MYCOSTATIN) 100000 UNIT/ML suspension Take 5 mLs (500,000 Units total) by mouth 4 (four) times daily. (Patient not taking: Reported on 11/25/2021) 200 mL 2   ondansetron (ZOFRAN-ODT) 4 MG disintegrating tablet Take 4 mg by mouth every 8 (eight) hours as needed. (Patient not taking: Reported on 11/25/2021)     valACYclovir (VALTREX) 500 MG tablet TAKE 1 TABLET BY MOUTH DAILY. CAN INCREASE TO TWICE A DAY FOR 5 DAYS IN THE EVENT OF A RECURRENCE 90 tablet 0   Current Facility-Administered Medications on File Prior to Visit  Medication Dose Route Frequency Provider Last Rate Last Admin   albuterol (PROVENTIL) (2.5 MG/3ML) 0.083% nebulizer solution 2.5 mg  2.5 mg Nebulization Once Apolonio Schneiders, FNP         Objective:     Vitals:   11/27/21 0810  BP: 127/87  Pulse: 88  Resp: 16    Filed Weights   11/27/21 0810  Weight: 230 lb 14.4 oz (104.7 kg)              Physical examination General NAD, Conversant  HEENT Atraumatic; Op clear with mmm.  Normo-cephalic. Pupils reactive.  Anicteric sclerae  Thyroid/Neck Smooth without nodularity or enlargement. Normal ROM.  Neck Supple.  Skin No rashes, lesions or ulceration. Normal palpated skin turgor. No nodularity.  Breasts: No masses or discharge.  Symmetric.  No axillary adenopathy.  Lungs: Clear to auscultation.No rales or wheezes. Normal Respiratory effort, no retractions.  Heart: NSR.  No murmurs or rubs appreciated. No periferal edema  Abdomen: Soft.  Non-tender.  No masses.  No HSM. No hernia  Extremities: Moves all appropriately.  Normal ROM for age. No lymphadenopathy.  Neuro: Oriented to PPT.  Normal mood. Normal affect.     Pelvic:   Vulva: Normal appearance.  No lesions.  Vagina: No lesions or abnormalities noted.  Support: Normal pelvic support.  Urethra No masses tenderness or scarring.  Meatus Normal size without lesions or prolapse.  Cervix: Normal appearance.  No lesions.  IUD strings noted at the cervical os  Anus: Normal exam.  No lesions.  Perineum: Normal exam.  No lesions.        Bimanual   Uterus: Normal size.  Non-tender.  Mobile.  AV.  Adnexae: No masses.  Non-tender to palpation.  Cul-de-sac: Negative for abnormality.     Assessment:    G0P0000 Patient Active Problem List   Diagnosis Date Noted   Sickle cell trait ([place]) 11/25/2021   RBC microcytosis 05/16/2021   Iron deficiency anemia 05/16/2021   Abnormal uterine  bleeding ([facility]) 08/19/2020   IUD (intrauterine device) in place 08/28/2019   Insulin pump status 01/13/2019   Graves disease 12/14/2018   Uncontrolled type 1 diabetes mellitus with hyperglycemia ([place]) 12/14/2018   Herpes simplex infection 10/29/2017   Anemia 06/05/2014   Type 1 diabetes mellitus ([place]) 06/05/2014     1. Encounter for well woman exam with routine gynecological exam   2. Cervical cancer screening        Plan:            1.  Basic Screening Recommendations The basic screening recommendations for asymptomatic women were discussed with the patient  during her visit.  The age-appropriate recommendations were discussed with her and the rational for the tests reviewed.  When I am informed by the patient that another primary care physician has previously obtained the age-appropriate tests and they are up-to-date, only outstanding tests are ordered and referrals given as necessary.  Abnormal results of tests will be discussed with her when all of her results are completed.  Routine preventative health maintenance measures emphasized: Exercise/Diet/Weight control, Tobacco Warnings, Alcohol/Substance use risks and Stress Management Pap performed Orders No orders of the defined types were placed in this encounter.   No orders of the defined types were placed in this encounter.         F/U  Return in about 1 year (around 11/27/2022) for Annual Physical.  Finis Bud, M.D. 11/27/2021 8:43 AM

## 2021-12-01 LAB — CYTOLOGY - PAP
Comment: NEGATIVE
Diagnosis: NEGATIVE
High risk HPV: NEGATIVE

## 2021-12-02 ENCOUNTER — Other Ambulatory Visit: Payer: Self-pay

## 2021-12-02 ENCOUNTER — Inpatient Hospital Stay: Payer: BC Managed Care – PPO

## 2021-12-02 VITALS — BP 109/79 | HR 75 | Temp 98.9°F | Resp 16

## 2021-12-02 DIAGNOSIS — Z793 Long term (current) use of hormonal contraceptives: Secondary | ICD-10-CM | POA: Diagnosis not present

## 2021-12-02 DIAGNOSIS — D509 Iron deficiency anemia, unspecified: Secondary | ICD-10-CM

## 2021-12-02 DIAGNOSIS — Z79899 Other long term (current) drug therapy: Secondary | ICD-10-CM | POA: Diagnosis not present

## 2021-12-02 DIAGNOSIS — Z794 Long term (current) use of insulin: Secondary | ICD-10-CM | POA: Diagnosis not present

## 2021-12-02 DIAGNOSIS — D573 Sickle-cell trait: Secondary | ICD-10-CM | POA: Diagnosis not present

## 2021-12-02 DIAGNOSIS — E109 Type 1 diabetes mellitus without complications: Secondary | ICD-10-CM | POA: Diagnosis not present

## 2021-12-02 DIAGNOSIS — Z9641 Presence of insulin pump (external) (internal): Secondary | ICD-10-CM | POA: Diagnosis not present

## 2021-12-02 MED ORDER — SODIUM CHLORIDE 0.9 % IV SOLN
Freq: Once | INTRAVENOUS | Status: AC
  Filled 2021-12-02: qty 250

## 2021-12-02 MED ORDER — SODIUM CHLORIDE 0.9 % IV SOLN: 200.0000 mg | Freq: Once | INTRAVENOUS | Status: DC

## 2021-12-02 MED ORDER — IRON SUCROSE 20 MG/ML IV SOLN
200.0000 mg | Freq: Once | INTRAVENOUS | Status: AC
  Administered 2021-12-02: 200 mg via INTRAVENOUS
  Filled 2021-12-02: qty 10

## 2021-12-02 NOTE — Patient Instructions (Signed)

## 2021-12-13 ENCOUNTER — Other Ambulatory Visit: Payer: Self-pay | Admitting: Obstetrics and Gynecology

## 2021-12-13 DIAGNOSIS — Z8619 Personal history of other infectious and parasitic diseases: Secondary | ICD-10-CM

## 2021-12-30 ENCOUNTER — Other Ambulatory Visit: Payer: BC Managed Care – PPO

## 2021-12-30 ENCOUNTER — Other Ambulatory Visit: Payer: Self-pay

## 2021-12-30 DIAGNOSIS — E8881 Metabolic syndrome: Secondary | ICD-10-CM | POA: Diagnosis not present

## 2021-12-30 DIAGNOSIS — E1065 Type 1 diabetes mellitus with hyperglycemia: Secondary | ICD-10-CM | POA: Diagnosis not present

## 2021-12-30 DIAGNOSIS — E1069 Type 1 diabetes mellitus with other specified complication: Secondary | ICD-10-CM

## 2021-12-30 DIAGNOSIS — D509 Iron deficiency anemia, unspecified: Secondary | ICD-10-CM

## 2021-12-30 DIAGNOSIS — Z794 Long term (current) use of insulin: Secondary | ICD-10-CM | POA: Diagnosis not present

## 2021-12-30 DIAGNOSIS — E109 Type 1 diabetes mellitus without complications: Secondary | ICD-10-CM

## 2021-12-31 LAB — MICROALBUMIN / CREATININE URINE RATIO
Creatinine, Urine: 117.5 mg/dL
Microalb/Creat Ratio: 4 mg/g creat (ref 0–29)
Microalbumin, Urine: 4.3 ug/mL

## 2021-12-31 LAB — LIPID PANEL
Chol/HDL Ratio: 3.5 ratio (ref 0.0–4.4)
Cholesterol, Total: 194 mg/dL (ref 100–199)
HDL: 55 mg/dL (ref >39)
LDL Chol Calc (NIH): 121 mg/dL — ABNORMAL HIGH (ref 0–99)
Triglycerides: 99 mg/dL (ref 0–149)
VLDL Cholesterol Cal: 18 mg/dL (ref 5–40)

## 2021-12-31 LAB — BASIC METABOLIC PANEL
BUN/Creatinine Ratio: 16 (ref 9–23)
BUN: 17 mg/dL (ref 6–20)
CO2: 23 mmol/L (ref 20–29)
Calcium: 9.1 mg/dL (ref 8.7–10.2)
Chloride: 105 mmol/L (ref 96–106)
Creatinine, Ser: 1.04 mg/dL — ABNORMAL HIGH (ref 0.57–1.00)
Glucose: 122 mg/dL — ABNORMAL HIGH (ref 70–99)
Potassium: 4.7 mmol/L (ref 3.5–5.2)
Sodium: 139 mmol/L (ref 134–144)
eGFR: 76 mL/min/{1.73_m2} (ref >59)

## 2021-12-31 LAB — TSH: TSH: 12.4 u[IU]/mL — ABNORMAL HIGH (ref 0.450–4.500)

## 2022-01-15 ENCOUNTER — Other Ambulatory Visit: Payer: Self-pay

## 2022-01-15 ENCOUNTER — Ambulatory Visit: Payer: BC Managed Care – PPO | Admitting: Nurse Practitioner

## 2022-01-15 DIAGNOSIS — M79671 Pain in right foot: Secondary | ICD-10-CM

## 2022-01-16 NOTE — Progress Notes (Signed)
? ?  Subjective:  ? ? Patient ID: Patricia Ramirez, female    DOB: 11/09/1995, 26 y.o.   MRN: [id number] ? ?HPI ? ?26 year old female presenting to [facility] with complaints of right foot pain. One week ago she was stepped on accidentally and has had pain in her foot since that time. The pain is on the medial/lateral aspect of her foot. Pain worsens when foot flexes or bends but pain is relieved if foot is in a neutral position. No bruising was noted by patient after accident.  ? ?Review of Systems  ?Constitutional: Negative.   ?HENT: Negative.    ?Respiratory: Negative.    ?Musculoskeletal:  Positive for arthralgias.  ?Skin: Negative.   ? ?   ?Objective:  ? Physical Exam ?HENT:  ?   Head: Normocephalic.  ?Musculoskeletal:  ?   Right foot: Normal range of motion. Tenderness present. No swelling or deformity.  ?     Feet: ? ?   Comments: Pain to highlighted region of right foot. Mild swelling to central medial lateral portion of right foot without discoloration or bruising. Sensation and circulation intact. Able to flex and extend toes without difficulty. Pain with palpation to highlighted region and with weight bearing.   ?Skin: ?   General: Skin is warm.  ?   Findings: No bruising.  ?Neurological:  ?   Mental Status: She is alert.  ? ? ? ? ? ?   ?Assessment & Plan:  ?1. Pain in right foot ?Advised wearing supportive shoes at all time, may use ibuprofen up to 600mg  three times daily as needed for pain (take with food). Elevated foot when able, ice at the end of the day as needed  ? ?If pain persists with rest over the weekend consider imaging. Discussed options of [facility] or outpatient imaging to r/u fracture.  ? ?Patient will follow up after weekend for plan  ?   ? ?

## 2022-02-02 ENCOUNTER — Encounter: Payer: Self-pay | Admitting: Obstetrics and Gynecology

## 2022-02-05 ENCOUNTER — Ambulatory Visit: Payer: BC Managed Care – PPO | Admitting: Obstetrics

## 2022-02-05 VITALS — BP 124/88 | HR 86 | Ht 64.5 in | Wt 234.5 lb

## 2022-02-05 DIAGNOSIS — N750 Cyst of Bartholin's gland: Secondary | ICD-10-CM

## 2022-02-05 MED ORDER — FLUCONAZOLE 150 MG PO TABS
150.0000 mg | ORAL_TABLET | Freq: Once | ORAL | 0 refills | Status: AC
Start: 2022-02-05 — End: 2022-02-05

## 2022-02-05 MED ORDER — SULFAMETHOXAZOLE-TRIMETHOPRIM 800-160 MG PO TABS: 1.0000 | ORAL_TABLET | Freq: Two times a day (BID) | ORAL | 0 refills | Status: AC

## 2022-02-05 NOTE — Progress Notes (Signed)
GYN ENCOUNTER ? ?Encounter for Vulvar Cyst ? ?Subjective ? ?HPI: Patricia Ramirez is a 26 y.o. G0P0000 who presents today for a painful cyst on her right labia. She first noticed this approximately two weeks ago, and it has gradually become larger and more painful. She denies any discharge or bleeding from the site. It is painful with urination. She has a history of HSV 2, but she states that this does not feel like an outbreak. She denies any new exposures to STIs, changes in detergents or body products, or injury to the area. She has tried putting Neosporin on it with no improvement. ? ?Past Medical History:  ?Diagnosis Date  ? Anemia   ? Graves' disease in remission   ? Type 1 diabetes ([place])   ? ?Past Surgical History:  ?Procedure Laterality Date  ? COLONOSCOPY  2004  ? [place] EXTRACTION  2019  ? ?OB History   ? ? Gravida  ?0  ? Para  ?0  ? Term  ?0  ? Preterm  ?0  ? AB  ?0  ? Living  ?0  ?  ? ? SAB  ?0  ? IAB  ?0  ? Ectopic  ?0  ? Multiple  ?0  ? Live Births  ?0  ?   ?  ?  ? ?Allergies  ?Allergen Reactions  ? Tuberculin Tests Rash  ?  2013/2014  ? Other Rash  ?  TB SKIN TEST  ? Tuberculin Ppd Itching  ? ? ? ?Negative except as noted in HPI ?History obtained from the patient ? ?Objective ? ?There were no vitals taken for this visit. ? ?General appearance: alert, cooperative, uncomfortable ?Pelvic: Right labia minora swollen with cyst approximately 1-2 cm. Very painful to palpation. Mildly erythematous. No drainage noted. ? ?Assessment ?1) Bartholin's cyst ? ?Plan ?1) Advised warm soaks, compresses, ibuprofen for pain relief. Bactrim DS BID x 5 days prescribed. Keep area clean. Return for assesment if pain and swelling worsen over the next few days. ? ? ?Lloyd Huger, CNM ? ? ?

## 2022-02-09 ENCOUNTER — Ambulatory Visit (INDEPENDENT_AMBULATORY_CARE_PROVIDER_SITE_OTHER): Payer: BC Managed Care – PPO | Admitting: Certified Nurse Midwife

## 2022-02-09 VITALS — BP 119/83 | HR 89 | Ht 64.0 in | Wt 239.7 lb

## 2022-02-09 DIAGNOSIS — N750 Cyst of Bartholin's gland: Secondary | ICD-10-CM | POA: Diagnosis not present

## 2022-02-09 NOTE — Progress Notes (Signed)
GYN ENCOUNTER NOTE ? ?Subjective:  ?    ? Patricia Ramirez is a 26 y.o. G0P0000 female is here for gynecologic evaluation of the following issues:  ?1. Pt was seen a last week for bartholin glands cyst . It was treated with antibiotics , warm soaks and states that it oozed for a few days and is feeling better. She is here today for follow up.   ? She denies fever or worsening. States it feels much better.  ? ?Obstetric History ?OB History  ?Gravida Para Term Preterm AB Living  ?0 0 0 0 0 0  ?SAB IAB Ectopic Multiple Live Births  ?0 0 0 0 0  ? ? ?Past Medical History:  ?Diagnosis Date  ? Anemia   ? Graves' disease in remission   ? Type 1 diabetes ([place])   ? ? ?Past Surgical History:  ?Procedure Laterality Date  ? COLONOSCOPY  2004  ? [place] EXTRACTION  2019  ? ? ?Current Outpatient Medications on File Prior to Visit  ?Medication Sig Dispense Refill  ? insulin aspart (NOVOLOG) 100 UNIT/ML injection INJECT SUBCUTANEOUSLY VIA INSULIN PUMP UP TO 90 UNITS/DAY    ? Insulin Disposable Pump (OMNIPOD 5 G6 INTRO, GEN 5,) KIT Inject into the skin.    ? levonorgestrel (MIRENA) 20 MCG/DAY IUD by Intrauterine route.    ? sulfamethoxazole-trimethoprim (BACTRIM DS) 800-160 MG tablet Take 1 tablet by mouth 2 (two) times daily for 5 days. 10 tablet 0  ? valACYclovir (VALTREX) 500 MG tablet TAKE 1 TABLET BY MOUTH DAILY. CAN INCREASE TO TWICE A DAY FOR 5 DAYS IN THE EVENT OF A RECURRENCE 90 tablet 0  ? ALPRAZolam (XANAX) 0.25 MG tablet Take 0.25 mg by mouth at bedtime as needed for anxiety. (Patient not taking: Reported on 02/09/2022)    ? amoxicillin-clavulanate (AUGMENTIN) 875-125 MG tablet Take 1 tablet by mouth 2 (two) times daily. (Patient not taking: Reported on 11/25/2021) 20 tablet 0  ? Blood Glucose Monitoring Suppl (CONTOUR NEXT MONITOR) w/Device KIT  (Patient not taking: Reported on 08/25/2021)    ? Cholecalciferol 50 MCG (2000 UT) TABS Take by mouth. (Patient not taking: Reported on 02/09/2022)    ? CONTOUR NEXT TEST test  strip  (Patient not taking: Reported on 08/25/2021)    ? cyclobenzaprine (FLEXERIL) 10 MG tablet Take 5 mg by mouth 3 (three) times daily as needed. (Patient not taking: Reported on 11/25/2021)    ? glucagon 1 MG injection Administer subcutaneously for severe hypoglycemia when other corrections have not been successful. (Patient not taking: Reported on 08/25/2021)    ? iron polysaccharides (NIFEREX) 150 MG capsule Take 1 capsule (150 mg total) by mouth daily. (Patient not taking: Reported on 11/25/2021) 30 capsule 2  ? Lancets (ONETOUCH ULTRASOFT) lancets Use. (Patient not taking: Reported on 05/16/2021)    ? neomycin-polymyxin b-dexamethasone (MAXITROL) 3.5-10000-0.1 SUSP SMARTSIG:2 Drop(s) In Eye(s) Every 6 Hours (Patient not taking: Reported on 11/25/2021)    ? nystatin (MYCOSTATIN) 100000 UNIT/ML suspension Take 5 mLs (500,000 Units total) by mouth 4 (four) times daily. (Patient not taking: Reported on 11/25/2021) 200 mL 2  ? ondansetron (ZOFRAN-ODT) 4 MG disintegrating tablet Take 4 mg by mouth every 8 (eight) hours as needed. (Patient not taking: Reported on 11/25/2021)    ? ?Current Facility-Administered Medications on File Prior to Visit  ?Medication Dose Route Frequency Provider Last Rate Last Admin  ? albuterol (PROVENTIL) (2.5 MG/3ML) 0.083% nebulizer solution 2.5 mg  2.5 mg Nebulization Once Apolonio Schneiders, FNP      ? ? ?  Allergies  ?Allergen Reactions  ? Tuberculin Tests Rash  ?  2013/2014  ? Other Rash  ?  TB SKIN TEST  ? Tuberculin Ppd Itching  ? ? ?Social History  ? ?Socioeconomic History  ? Marital status: Married  ?  Spouse name: Not on file  ? Number of children: Not on file  ? Years of education: Not on file  ? Highest education level: Not on file  ?Occupational History  ? Not on file  ?Tobacco Use  ? Smoking status: Never  ? Smokeless tobacco: Never  ?Vaping Use  ? Vaping Use: Never used  ?Substance and Sexual Activity  ? Alcohol use: Yes  ?  Comment: occasionaly  ? Drug use: Never  ? Sexual activity:  Yes  ?  Birth control/protection: I.U.D.  ?Other Topics Concern  ? Not on file  ?Social History Narrative  ? ** Merged History Encounter **  ?    ? ?Social Determinants of Health  ? ?Financial Resource Strain: Not on file  ?Food Insecurity: Not on file  ?Transportation Needs: Not on file  ?Physical Activity: Not on file  ?Stress: Not on file  ?Social Connections: Not on file  ?Intimate Partner Violence: Not on file  ? ? ?Family History  ?Problem Relation Age of Onset  ? Hypertension Father   ? Breast cancer Maternal Grandmother   ? Glaucoma Maternal Grandfather   ? Prostate cancer Maternal Grandfather   ? Hypertension Paternal Grandfather   ? ? ?The following portions of the patient's history were reviewed and updated as appropriate: allergies, current medications, past family history, past medical history, past social history, past surgical history and problem list. ? ?Review of Systems ?Review of Systems - Negative except as mentioned below ?Review of Systems - General ROS: negative for - chills, fatigue, fever, hot flashes, malaise or night sweats ?Hematological and Lymphatic ROS: negative for - bleeding problems or swollen lymph nodes ?Gastrointestinal ROS: negative for - abdominal pain, blood in stools, change in bowel habits and nausea/vomiting ?Musculoskeletal ROS: negative for - joint pain, muscle pain or muscular weakness ?Genito-Urinary ROS: negative for - change in menstrual cycle, dysmenorrhea, dyspareunia, dysuria, genital discharge, genital ulcers, hematuria, incontinence, irregular/heavy menses, nocturia or pelvic pain ? ?Objective:  ? ?BP 119/83   Pulse 89   Ht 5' 4"  (1.626 m)   Wt 239 lb 11.2 oz (108.7 kg)   LMP  (LMP Unknown)   BMI 41.14 kg/m?  ?CONSTITUTIONAL: Well-developed, well-nourished female in no acute distress.  ?HENT:  Normocephalic, atraumatic.  ?NECK: Normal range of motion, supple, no masses.  Normal thyroid.  ?SKIN: Skin is warm and dry. No rash noted. Not diaphoretic. No  erythema. No pallor. ?[place]: Alert and oriented to person, place, and time. PSYCHIATRIC: Normal mood and affect. Normal behavior. Normal judgment and thought content. ?CARDIOVASCULAR:Not Examined ?RESPIRATORY: Not Examined ?BREASTS: Not Examined ?ABDOMEN: Soft, non distended; Non tender.  No Organomegaly. ?PELVIC: ? External Genitalia: Normal ? BUS: Normal ? Vagina: Normal, no fluid palpated in exam ?MUSCULOSKELETAL: Normal range of motion. No tenderness.  No cyanosis, clubbing, or edema. ? ? ?Assessment:  ? ?Bartholin Glands cyst  ? ? ?Plan:  ? ?Bartholin gland cyst resolved. Discussed talking all of the antibiotics as orders. Discussed in future should she start to have symptoms to do epsom salt soak and warm compress to area. She verbalizes and agrees. Follow up PRN.  ? ?Philip Aspen, CNM   ?

## 2022-02-11 ENCOUNTER — Encounter: Payer: BC Managed Care – PPO | Admitting: Obstetrics and Gynecology

## 2022-02-16 ENCOUNTER — Encounter: Payer: Self-pay | Admitting: Nurse Practitioner

## 2022-02-16 ENCOUNTER — Other Ambulatory Visit: Payer: Self-pay | Admitting: Nurse Practitioner

## 2022-02-16 DIAGNOSIS — B379 Candidiasis, unspecified: Secondary | ICD-10-CM

## 2022-02-16 DIAGNOSIS — B370 Candidal stomatitis: Secondary | ICD-10-CM

## 2022-02-16 MED ORDER — FLUCONAZOLE 150 MG PO TABS: 150.0000 mg | ORAL_TABLET | Freq: Once | ORAL | 0 refills | Status: AC

## 2022-02-16 MED ORDER — NYSTATIN 100000 UNIT/ML MT SUSP: 5.0000 mL | Freq: Four times a day (QID) | OROMUCOSAL | 0 refills | Status: DC

## 2022-02-16 MED ORDER — FLUCONAZOLE 150 MG PO TABS: 150.0000 mg | ORAL_TABLET | Freq: Once | ORAL | 0 refills | Status: DC

## 2022-02-16 MED ORDER — NYSTATIN 100000 UNIT/ML MT SUSP: 5.0000 mL | Freq: Four times a day (QID) | OROMUCOSAL | 2 refills | Status: DC

## 2022-02-16 NOTE — Progress Notes (Signed)
[facility] encounter for vaginal yeast symptoms secondary to oral antibiotics.  ?Refilled oral nystatin for prophylactic oral thrush prevention ? ?Meds ordered this encounter  ?Medications  ? fluconazole (DIFLUCAN) 150 MG tablet  ?  Sig: Take 1 tablet (150 mg total) by mouth once for 1 dose.  ?  Dispense:  1 tablet  ?  Refill:  0  ? nystatin (MYCOSTATIN) 100000 UNIT/ML suspension  ?  Sig: Take 5 mLs (500,000 Units total) by mouth 4 (four) times daily.  ?  Dispense:  200 mL  ?  Refill:  2  ?  ?

## 2022-02-20 DIAGNOSIS — E109 Type 1 diabetes mellitus without complications: Secondary | ICD-10-CM | POA: Diagnosis not present

## 2022-02-23 DIAGNOSIS — Z794 Long term (current) use of insulin: Secondary | ICD-10-CM | POA: Diagnosis not present

## 2022-02-23 DIAGNOSIS — E039 Hypothyroidism, unspecified: Secondary | ICD-10-CM | POA: Diagnosis not present

## 2022-02-23 DIAGNOSIS — E1165 Type 2 diabetes mellitus with hyperglycemia: Secondary | ICD-10-CM | POA: Diagnosis not present

## 2022-03-17 ENCOUNTER — Other Ambulatory Visit: Payer: Self-pay | Admitting: Obstetrics and Gynecology

## 2022-03-17 DIAGNOSIS — Z8619 Personal history of other infectious and parasitic diseases: Secondary | ICD-10-CM

## 2022-04-17 ENCOUNTER — Encounter: Payer: Self-pay | Admitting: Adult Health

## 2022-04-17 ENCOUNTER — Telehealth: Payer: Self-pay | Admitting: Obstetrics and Gynecology

## 2022-04-17 ENCOUNTER — Ambulatory Visit: Payer: BC Managed Care – PPO | Admitting: Adult Health

## 2022-04-17 VITALS — BP 122/76 | HR 91 | Temp 98.2°F | Ht 64.5 in | Wt 225.0 lb

## 2022-04-17 DIAGNOSIS — N750 Cyst of Bartholin's gland: Secondary | ICD-10-CM

## 2022-04-17 MED ORDER — AMOXICILLIN-POT CLAVULANATE 875-125 MG PO TABS: 1.0000 | ORAL_TABLET | Freq: Two times a day (BID) | ORAL | 0 refills | Status: DC

## 2022-04-17 NOTE — Progress Notes (Signed)
[facility] [address] [place], [place] [address]   Office Visit Note  Patient Name: Patricia Ramirez Date of Birth [id number]  Medical Record number [id number]  Date of Service: 04/17/2022  Chief Complaint  Patient presents with   Female GU Problem     Female GU Problem   Pt is here for a sick visit. She reports 2 days of vaginal pain.  She states that she had a Bartholin cyst in April, and this is exactly the same.  She did not have it drained in April, as antibiotics did well.  She denies any fever, but reports discomfort with sitting. Her GYN can not see her until Tuesday.    Current Medication:  Outpatient Encounter Medications as of 04/17/2022  Medication Sig   ALPRAZolam (XANAX) 0.25 MG tablet Take 0.25 mg by mouth at bedtime as needed for anxiety.   Blood Glucose Monitoring Suppl (CONTOUR NEXT MONITOR) w/Device KIT    Cholecalciferol 50 MCG (2000 UT) TABS Take by mouth.   CONTOUR NEXT TEST test strip    cyclobenzaprine (FLEXERIL) 10 MG tablet Take 5 mg by mouth 3 (three) times daily as needed.   glucagon 1 MG injection    insulin aspart (NOVOLOG) 100 UNIT/ML injection INJECT SUBCUTANEOUSLY VIA INSULIN PUMP UP TO 90 UNITS/DAY   Insulin Disposable Pump (OMNIPOD 5 G6 INTRO, GEN 5,) KIT Inject into the skin.   iron polysaccharides (NIFEREX) 150 MG capsule Take 1 capsule (150 mg total) by mouth daily.   Lancets (ONETOUCH ULTRASOFT) lancets    levonorgestrel (MIRENA) 20 MCG/DAY IUD by Intrauterine route.   levothyroxine (SYNTHROID) 50 MCG tablet Take 50 mcg by mouth every morning.   neomycin-polymyxin b-dexamethasone (MAXITROL) 3.5-10000-0.1 SUSP    nystatin (MYCOSTATIN) 100000 UNIT/ML suspension Take 5 mLs (500,000 Units total) by mouth 4 (four) times daily.   nystatin (MYCOSTATIN) 100000 UNIT/ML suspension Take 5 mLs (500,000 Units total) by mouth 4 (four) times daily.   ondansetron (ZOFRAN-ODT) 4 MG disintegrating tablet Take 4 mg by mouth every 8 (eight) hours as  needed.   OZEMPIC, 0.25 OR 0.5 MG/DOSE, 2 MG/3ML SOPN Inject into the skin.   valACYclovir (VALTREX) 500 MG tablet TAKE 1 TABLET BY MOUTH DAILY. CAN INCREASE TO TWICE A DAY FOR 5 DAYS IN THE EVENT OF A RECURRENCE   amoxicillin-clavulanate (AUGMENTIN) 875-125 MG tablet Take 1 tablet by mouth 2 (two) times daily.   [DISCONTINUED] amoxicillin-clavulanate (AUGMENTIN) 875-125 MG tablet Take 1 tablet by mouth 2 (two) times daily. (Patient not taking: Reported on 11/25/2021)   Facility-Administered Encounter Medications as of 04/17/2022  Medication   albuterol (PROVENTIL) (2.5 MG/3ML) 0.083% nebulizer solution 2.5 mg      Medical History: Past Medical History:  Diagnosis Date   Anemia    Graves' disease in remission    Type 1 diabetes ([facility])      Vital Signs: BP 122/76   Pulse 91   Temp 98.2 F (36.8 C) (Tympanic)   Ht 5' 4.5" (1.638 m)   Wt 225 lb (102.1 kg)   SpO2 99%   BMI 38.02 kg/m    Review of Systems  Genitourinary:  Positive for vaginal pain.    Physical Exam Exam conducted with a chaperone present.  Constitutional:      Appearance: Normal appearance.  Genitourinary:    General: Normal vulva.     Exam position: Lithotomy position.     Pubic Area: No rash.        Comments: No visible swelling on  outer vaginal area.  Discomfort with palpation on external area circled above, and with palpation inside vaginal canal.  Neurological:     Mental Status: She is alert.    Assessment/Plan: 1. Bartholin cyst Do Sitz Baths as discussed.  Use warm compress and take Ibuprofen 600-851m every 6-8 hours for pain/inflammation. May take Tylenol also. See GYN as discussed on Tuesday, if no pain relief is obtained, patient should seek help at urgent care or ED over weekend.  - amoxicillin-clavulanate (AUGMENTIN) 875-125 MG tablet; Take 1 tablet by mouth 2 (two) times daily.  Dispense: 20 tablet; Refill: 0     General Counseling: Patricia Ramirez verbalizes understanding of the findings of  todays visit and agrees with plan of treatment. I have discussed any further diagnostic evaluation that may be needed or ordered today. We also reviewed her medications today. she has been encouraged to call the office with any questions or concerns that should arise related to todays visit.   No orders of the defined types were placed in this encounter.   Meds ordered this encounter  Medications   amoxicillin-clavulanate (AUGMENTIN) 875-125 MG tablet    Sig: Take 1 tablet by mouth 2 (two) times daily.    Dispense:  20 tablet    Refill:  0    Time spent:15 Minutes    AKendell BaneAGNP-C Nurse Practitioner

## 2022-04-17 NOTE — Telephone Encounter (Signed)
Pt called asking for an Emergent  work in - I informed pt we had no providers in the office today. Pt states "she is going to Urgent Care for a the labia cyst has returned and is causing extreme pain". I made a follow up appointment with Dr Logan Bores for 6/20.

## 2022-04-21 ENCOUNTER — Encounter: Payer: Self-pay | Admitting: Obstetrics and Gynecology

## 2022-04-21 ENCOUNTER — Ambulatory Visit: Payer: BC Managed Care – PPO | Admitting: Obstetrics and Gynecology

## 2022-04-21 VITALS — BP 120/87 | HR 94 | Ht 64.5 in | Wt 227.4 lb

## 2022-04-21 DIAGNOSIS — N750 Cyst of Bartholin's gland: Secondary | ICD-10-CM

## 2022-04-21 NOTE — Progress Notes (Signed)
HPI:      [name] is a 26 y.o. G0P0000 who LMP was No LMP recorded. (Menstrual status: IUD).  Subjective:   She presents today for follow-up of labial cyst previously diagnosed as Bartholin gland.  She reports that it is almost gone at this point.  She says it has been draining.  This is the second time it has occurred and she would very much not like to have it continued to recur.  She has been doing sitz bath's and taking antibiotics. Significant note patient has diabetes.    Hx: The following portions of the patient's history were reviewed and updated as appropriate:             She  has a past medical history of Anemia, Graves' disease in remission, and Type 1 diabetes ([place]). She does not have any pertinent problems on file. She  has a past surgical history that includes Colonoscopy (2004) and [place] tooth extraction (2019). Her family history includes Breast cancer in her maternal grandmother; Glaucoma in her maternal grandfather; Hypertension in her father and paternal grandfather; Prostate cancer in her maternal grandfather. She  reports that she has never smoked. She has never used smokeless tobacco. She reports current alcohol use. She reports that she does not use drugs. She has a current medication list which includes the following prescription(s): alprazolam, amoxicillin-clavulanate, contour next monitor, cholecalciferol, contour next test, cyclobenzaprine, glucagon, insulin aspart, omnipod 5 g6 intro (gen 5), iron polysaccharides, onetouch ultrasoft, levonorgestrel, levothyroxine, ondansetron, ozempic (0.25 or 0.5 mg/dose), and valacyclovir, and the following Facility-Administered Medications: albuterol. She is allergic to tuberculin tests, aleve [naproxen], other, and tuberculin ppd.       Review of Systems:  Review of Systems  Constitutional: Denied constitutional symptoms, night sweats, recent illness, fatigue, fever, insomnia and weight loss.  Eyes: Denied eye  symptoms, eye pain, photophobia, vision change and visual disturbance.  Ears/Nose/Throat/Neck: Denied ear, nose, throat or neck symptoms, hearing loss, nasal discharge, sinus congestion and sore throat.  Cardiovascular: Denied cardiovascular symptoms, arrhythmia, chest pain/pressure, edema, exercise intolerance, orthopnea and palpitations.  Respiratory: Denied pulmonary symptoms, asthma, pleuritic pain, productive sputum, cough, dyspnea and wheezing.  Gastrointestinal: Denied, gastro-esophageal reflux, melena, nausea and vomiting.  Genitourinary: See HPI for additional information.  Musculoskeletal: Denied musculoskeletal symptoms, stiffness, swelling, muscle weakness and myalgia.  Dermatologic: Denied dermatology symptoms, rash and scar.  Neurologic: Denied neurology symptoms, dizziness, headache, neck pain and syncope.  Psychiatric: Denied psychiatric symptoms, anxiety and depression.  Endocrine: Denied endocrine symptoms including hot flashes and night sweats.   Meds:   Current Outpatient Medications on File Prior to Visit  Medication Sig Dispense Refill   ALPRAZolam (XANAX) 0.25 MG tablet Take 0.25 mg by mouth at bedtime as needed for anxiety.     amoxicillin-clavulanate (AUGMENTIN) 875-125 MG tablet Take 1 tablet by mouth 2 (two) times daily. 20 tablet 0   Blood Glucose Monitoring Suppl (CONTOUR NEXT MONITOR) w/Device KIT      Cholecalciferol 50 MCG (2000 UT) TABS Take by mouth.     CONTOUR NEXT TEST test strip      cyclobenzaprine (FLEXERIL) 10 MG tablet Take 5 mg by mouth 3 (three) times daily as needed.     glucagon 1 MG injection      insulin aspart (NOVOLOG) 100 UNIT/ML injection INJECT SUBCUTANEOUSLY VIA INSULIN PUMP UP TO 90 UNITS/DAY     Insulin Disposable Pump (OMNIPOD 5 G6 INTRO, GEN 5,) KIT Inject into the skin.     iron  polysaccharides (NIFEREX) 150 MG capsule Take 1 capsule (150 mg total) by mouth daily. 30 capsule 2   Lancets (ONETOUCH ULTRASOFT) lancets       levonorgestrel (MIRENA) 20 MCG/DAY IUD by Intrauterine route.     levothyroxine (SYNTHROID) 50 MCG tablet Take 50 mcg by mouth every morning.     ondansetron (ZOFRAN-ODT) 4 MG disintegrating tablet Take 4 mg by mouth every 8 (eight) hours as needed.     OZEMPIC, 0.25 OR 0.5 MG/DOSE, 2 MG/3ML SOPN Inject into the skin.     valACYclovir (VALTREX) 500 MG tablet TAKE 1 TABLET BY MOUTH DAILY. CAN INCREASE TO TWICE A DAY FOR 5 DAYS IN THE EVENT OF A RECURRENCE 90 tablet 0   Current Facility-Administered Medications on File Prior to Visit  Medication Dose Route Frequency Provider Last Rate Last Admin   albuterol (PROVENTIL) (2.5 MG/3ML) 0.083% nebulizer solution 2.5 mg  2.5 mg Nebulization Once Apolonio Schneiders, FNP          Objective:     Vitals:   04/21/22 1510  BP: 120/87  Pulse: 94   Filed Weights   04/21/22 1510  Weight: 227 lb 6.4 oz (103.1 kg)              Physical examination   Pelvic:   Vulva:    Vagina: No lesions or abnormalities noted.  Support: Normal pelvic support.  Urethra No masses tenderness or scarring.  Meatus Normal size without lesions or prolapse.  Cervix:   Anus: Normal exam.  No lesions.  Perineum: Normal exam.  No lesions.   Right labial cyst noted.  Approximately the size of a small pea.  Small area of drainage noted.  Milked gland until no further drainage.  Likely resolved at this point.          Assessment:    G0P0000 Patient Active Problem List   Diagnosis Date Noted   Sickle cell trait ([place]) 11/25/2021   RBC microcytosis 05/16/2021   Iron deficiency anemia 05/16/2021   Abnormal uterine bleeding (AUB) 08/19/2020   IUD (intrauterine device) in place 08/28/2019   Insulin pump status 01/13/2019   Graves disease 12/14/2018   Uncontrolled type 1 diabetes mellitus with hyperglycemia ([place]) 12/14/2018   Herpes simplex infection 10/29/2017   Anemia 06/05/2014   Type 1 diabetes mellitus ([place]) 06/05/2014     1. Bartholin's gland cyst     Mostly  resolved.  After visit today is resolved.   Plan:            1.  Continue sitz bath's for 1 more day.  2.  Recommend patient follow-up if she has another recurrence during the acute phase so that either Ward catheter or marsupialization can be performed.  She states that just the timing has been wrong where this has occurred over the weekend and by the time she can get an appointment it is much improved or drained.  Orders No orders of the defined types were placed in this encounter.   No orders of the defined types were placed in this encounter.     F/U  No follow-ups on file. I spent 23 minutes involved in the care of this patient preparing to see the patient by obtaining and reviewing her medical history (including labs, imaging tests and prior procedures), documenting clinical information in the electronic health record (EHR), counseling and coordinating care plans, writing and sending prescriptions, ordering tests or procedures and in direct communicating with the patient and medical staff discussing pertinent  items from her history and physical exam.  Finis Bud, M.D. 04/21/2022 4:18 PM

## 2022-04-21 NOTE — Progress Notes (Signed)
Patient presents today due to a vaginal cyst. She states problems began in April and that she is now having increased pain inside and noticing some drainage. Patient was seen by a health at work provider who restarted her on antibiotics, on day 3. She also has been using epsom salt soaks. No other concerns at this time.

## 2022-04-23 ENCOUNTER — Ambulatory Visit: Payer: BC Managed Care – PPO | Admitting: Adult Health

## 2022-04-23 ENCOUNTER — Encounter: Payer: Self-pay | Admitting: Adult Health

## 2022-04-23 VITALS — Temp 98.3°F

## 2022-04-23 DIAGNOSIS — L509 Urticaria, unspecified: Secondary | ICD-10-CM

## 2022-04-23 NOTE — Progress Notes (Signed)
[facility] [address] [place], [place] [address]   Office Visit Note  Patient Name: Patricia Ramirez Date of Birth [id number]  Medical Record number [id number]  Date of Service: 04/23/2022  Chief Complaint  Patient presents with   Rash     Rash Pertinent negatives include no fatigue.   Pt is here for a sick visit. Patient reports Hives, and itchy rash mostly at night.  She reports similar symptoms in the past when her thyroid was acting up.  She would like her labs checked at this time. She has been using benadryl PO, and topical hydrocortisone with mild relief. She also reports a history of Low Iron last year that she received Iron infusions for.  Has not been rechecked.    Current Medication:  Outpatient Encounter Medications as of 04/23/2022  Medication Sig   ALPRAZolam (XANAX) 0.25 MG tablet Take 0.25 mg by mouth at bedtime as needed for anxiety.   amoxicillin-clavulanate (AUGMENTIN) 875-125 MG tablet Take 1 tablet by mouth 2 (two) times daily.   Blood Glucose Monitoring Suppl (CONTOUR NEXT MONITOR) w/Device KIT    Cholecalciferol 50 MCG (2000 UT) TABS Take by mouth.   CONTOUR NEXT TEST test strip    cyclobenzaprine (FLEXERIL) 10 MG tablet Take 5 mg by mouth 3 (three) times daily as needed.   glucagon 1 MG injection    insulin aspart (NOVOLOG) 100 UNIT/ML injection INJECT SUBCUTANEOUSLY VIA INSULIN PUMP UP TO 90 UNITS/DAY   Insulin Disposable Pump (OMNIPOD 5 G6 INTRO, GEN 5,) KIT Inject into the skin.   iron polysaccharides (NIFEREX) 150 MG capsule Take 1 capsule (150 mg total) by mouth daily.   Lancets (ONETOUCH ULTRASOFT) lancets    levonorgestrel (MIRENA) 20 MCG/DAY IUD by Intrauterine route.   levothyroxine (SYNTHROID) 50 MCG tablet Take 50 mcg by mouth every morning.   ondansetron (ZOFRAN-ODT) 4 MG disintegrating tablet Take 4 mg by mouth every 8 (eight) hours as needed.   OZEMPIC, 0.25 OR 0.5 MG/DOSE, 2 MG/3ML SOPN Inject into the skin.   valACYclovir (VALTREX)  500 MG tablet TAKE 1 TABLET BY MOUTH DAILY. CAN INCREASE TO TWICE A DAY FOR 5 DAYS IN THE EVENT OF A RECURRENCE   Facility-Administered Encounter Medications as of 04/23/2022  Medication   albuterol (PROVENTIL) (2.5 MG/3ML) 0.083% nebulizer solution 2.5 mg      Medical History: Past Medical History:  Diagnosis Date   Anemia    Graves' disease in remission    Type 1 diabetes (HCC)      Vital Signs: Temp 98.3 F (36.8 C) (Tympanic)   SpO2 99%    Review of Systems  Constitutional:  Negative for fatigue.  Skin:  Positive for rash.    Physical Exam Constitutional:      Appearance: Normal appearance.  Skin:    General: Skin is warm and dry.     Findings: No rash.  Neurological:     Mental Status: She is alert.     Assessment/Plan: 1. Hives Will recheck levels as discussed, and follow up via mychart tomorrow.  - Ferritin - Iron, Total/Total Iron Binding Cap - Thyroid Panel With TSH     General Counseling: Patricia Ramirez verbalizes understanding of the findings of todays visit and agrees with plan of treatment. I have discussed any further diagnostic evaluation that may be needed or ordered today. We also reviewed her medications today. she has been encouraged to call the office with any questions or concerns that should arise related to todays visit.  Orders Placed This Encounter  Procedures   Ferritin   Iron, Total/Total Iron Binding Cap   Thyroid Panel With TSH    No orders of the defined types were placed in this encounter.   Time spent:20 Minutes    Patricia Ramirez AGNP-C Nurse Practitioner

## 2022-04-24 LAB — THYROID PANEL WITH TSH
Free Thyroxine Index: 1.9 (ref 1.2–4.9)
T3 Uptake Ratio: 25 % (ref 24–39)
T4, Total: 7.7 ug/dL (ref 4.5–12.0)
TSH: 9.89 u[IU]/mL — ABNORMAL HIGH (ref 0.450–4.500)

## 2022-04-24 LAB — FERRITIN: Ferritin: 98 ng/mL (ref 15–150)

## 2022-04-25 LAB — SPECIMEN STATUS REPORT

## 2022-04-25 LAB — IRON AND TIBC
Iron Saturation: 16 % (ref 15–55)
Iron: 46 ug/dL (ref 27–159)
Total Iron Binding Capacity: 293 ug/dL (ref 250–450)
UIBC: 247 ug/dL (ref 131–425)

## 2022-04-29 ENCOUNTER — Encounter: Payer: Self-pay | Admitting: Nurse Practitioner

## 2022-04-29 ENCOUNTER — Other Ambulatory Visit: Payer: Self-pay | Admitting: Medical

## 2022-04-29 DIAGNOSIS — B3731 Acute candidiasis of vulva and vagina: Secondary | ICD-10-CM

## 2022-04-29 MED ORDER — FLUCONAZOLE 150 MG PO TABS: 150.0000 mg | ORAL_TABLET | Freq: Every day | ORAL | 0 refills | Status: DC

## 2022-04-29 NOTE — Progress Notes (Signed)
Patient myChart Patricia Ramirez for a Diflucan pill. I have prescribed that for the patient.

## 2022-05-03 ENCOUNTER — Other Ambulatory Visit: Payer: Self-pay | Admitting: Obstetrics and Gynecology

## 2022-05-03 DIAGNOSIS — Z8619 Personal history of other infectious and parasitic diseases: Secondary | ICD-10-CM

## 2022-05-20 DIAGNOSIS — E109 Type 1 diabetes mellitus without complications: Secondary | ICD-10-CM | POA: Diagnosis not present

## 2022-05-25 ENCOUNTER — Inpatient Hospital Stay: Payer: BC Managed Care – PPO | Attending: Oncology

## 2022-05-25 ENCOUNTER — Other Ambulatory Visit: Payer: Self-pay

## 2022-05-25 DIAGNOSIS — Z793 Long term (current) use of hormonal contraceptives: Secondary | ICD-10-CM | POA: Insufficient documentation

## 2022-05-25 DIAGNOSIS — D509 Iron deficiency anemia, unspecified: Secondary | ICD-10-CM

## 2022-05-25 DIAGNOSIS — Z79899 Other long term (current) drug therapy: Secondary | ICD-10-CM | POA: Diagnosis not present

## 2022-05-25 DIAGNOSIS — D573 Sickle-cell trait: Secondary | ICD-10-CM | POA: Diagnosis not present

## 2022-05-25 DIAGNOSIS — Z794 Long term (current) use of insulin: Secondary | ICD-10-CM | POA: Insufficient documentation

## 2022-05-25 DIAGNOSIS — Z9641 Presence of insulin pump (external) (internal): Secondary | ICD-10-CM | POA: Insufficient documentation

## 2022-05-25 DIAGNOSIS — E109 Type 1 diabetes mellitus without complications: Secondary | ICD-10-CM | POA: Diagnosis not present

## 2022-05-25 LAB — IRON AND TIBC
Iron: 27 ug/dL — ABNORMAL LOW (ref 28–170)
Saturation Ratios: 10 % — ABNORMAL LOW (ref 10.4–31.8)
TIBC: 277 ug/dL (ref 250–450)
UIBC: 250 ug/dL

## 2022-05-25 LAB — CBC WITH DIFFERENTIAL/PLATELET
Abs Immature Granulocytes: 0.01 10*3/uL (ref 0.00–0.07)
Basophils Absolute: 0.1 10*3/uL (ref 0.0–0.1)
Basophils Relative: 1 %
Eosinophils Absolute: 0.1 10*3/uL (ref 0.0–0.5)
Eosinophils Relative: 1 %
HCT: 37.5 % (ref 36.0–46.0)
Hemoglobin: 12.9 g/dL (ref 12.0–15.0)
Immature Granulocytes: 0 %
Lymphocytes Relative: 33 %
Lymphs Abs: 2.1 10*3/uL (ref 0.7–4.0)
MCH: 27.6 pg (ref 26.0–34.0)
MCHC: 34.4 g/dL (ref 30.0–36.0)
MCV: 80.3 fL (ref 80.0–100.0)
Monocytes Absolute: 0.6 10*3/uL (ref 0.1–1.0)
Monocytes Relative: 10 %
Neutro Abs: 3.6 10*3/uL (ref 1.7–7.7)
Neutrophils Relative %: 55 %
Platelets: 287 10*3/uL (ref 150–400)
RBC: 4.67 MIL/uL (ref 3.87–5.11)
RDW: 14.6 % (ref 11.5–15.5)
WBC: 6.5 10*3/uL (ref 4.0–10.5)
nRBC: 0 % (ref 0.0–0.2)

## 2022-05-25 LAB — FERRITIN: Ferritin: 73 ng/mL (ref 11–307)

## 2022-05-26 ENCOUNTER — Encounter: Payer: Self-pay | Admitting: Oncology

## 2022-05-26 ENCOUNTER — Inpatient Hospital Stay: Payer: BC Managed Care – PPO

## 2022-05-26 ENCOUNTER — Inpatient Hospital Stay (HOSPITAL_BASED_OUTPATIENT_CLINIC_OR_DEPARTMENT_OTHER): Payer: BC Managed Care – PPO | Admitting: Oncology

## 2022-05-26 VITALS — BP 122/95 | HR 78 | Temp 98.4°F | Resp 18 | Wt 217.1 lb

## 2022-05-26 VITALS — BP 118/83 | HR 77

## 2022-05-26 DIAGNOSIS — Z79899 Other long term (current) drug therapy: Secondary | ICD-10-CM | POA: Diagnosis not present

## 2022-05-26 DIAGNOSIS — D573 Sickle-cell trait: Secondary | ICD-10-CM

## 2022-05-26 DIAGNOSIS — Z793 Long term (current) use of hormonal contraceptives: Secondary | ICD-10-CM | POA: Diagnosis not present

## 2022-05-26 DIAGNOSIS — D508 Other iron deficiency anemias: Secondary | ICD-10-CM | POA: Diagnosis not present

## 2022-05-26 DIAGNOSIS — D509 Iron deficiency anemia, unspecified: Secondary | ICD-10-CM

## 2022-05-26 DIAGNOSIS — E109 Type 1 diabetes mellitus without complications: Secondary | ICD-10-CM | POA: Diagnosis not present

## 2022-05-26 DIAGNOSIS — Z794 Long term (current) use of insulin: Secondary | ICD-10-CM | POA: Diagnosis not present

## 2022-05-26 DIAGNOSIS — Z9641 Presence of insulin pump (external) (internal): Secondary | ICD-10-CM | POA: Diagnosis not present

## 2022-05-26 MED ORDER — SODIUM CHLORIDE 0.9 % IV SOLN
INTRAVENOUS | Status: DC | PRN
  Filled 2022-05-26: qty 250

## 2022-05-26 MED ORDER — SODIUM CHLORIDE 0.9 % IV SOLN
200.0000 mg | Freq: Once | INTRAVENOUS | Status: AC
  Administered 2022-05-26: 200 mg via INTRAVENOUS
  Filled 2022-05-26: qty 200

## 2022-05-26 NOTE — Progress Notes (Signed)
Pt here for follow up. No new concerns voiced.   

## 2022-05-26 NOTE — Progress Notes (Signed)
Hematology/Oncology Progress note [phone] [phone]      Patient Care Team: Pcp, No as PCP - General  REFERRING PROVIDER: No ref. provider found  CHIEF COMPLAINTS/REASON FOR VISIT:  Evaluation of iron deficiency anemia  HISTORY OF PRESENTING ILLNESS:   Patricia Ramirez is a  26 y.o.  female follows up for iron deficiency anemia Patient reports history of iron deficiency anemia as a teenager. She was told that was due to heavy menstrual bleed.  She has had IUD placed in 2017 and has not had period.  No bloody or tarry stool, no epigastric discomfort.  She has type 1 DM and per patient , she may have diarrhea when blood sugar is not controlled.  She was told by her parents that she has sickle cell trait.   INTERVAL HISTORY Patricia Ramirez is a 26 y.o. female who has above history reviewed by me today presents for follow up visit for iron deficiency anemia Patient has previously tolerated IV Venofer treatments. Seizure-like activity, no recurrent episodes.  She has followed up with neurology. She denies any black stool or bloody stool.  No dietary restrictions. She has no menstrual period due to Mirena IUD.  Review of Systems  Constitutional:  Negative for appetite change, chills, fatigue and fever.  HENT:   Negative for hearing loss and voice change.   Eyes:  Negative for eye problems.  Respiratory:  Negative for chest tightness and cough.   Cardiovascular:  Negative for chest pain.  Gastrointestinal:  Negative for abdominal distention, abdominal pain and blood in stool.  Endocrine: Negative for hot flashes.  Genitourinary:  Negative for difficulty urinating and frequency.   Musculoskeletal:  Negative for arthralgias.  Skin:  Negative for itching and rash.  Neurological:  Negative for extremity weakness.  Hematological:  Negative for adenopathy.  Psychiatric/Behavioral:  Negative for confusion.     MEDICAL HISTORY:  Past Medical History:  Diagnosis  Date   Anemia    Graves' disease in remission    Type 1 diabetes ([place])     SURGICAL HISTORY: Past Surgical History:  Procedure Laterality Date   COLONOSCOPY  2004   WISDOM TOOTH EXTRACTION  2019    SOCIAL HISTORY: Social History   Socioeconomic History   Marital status: Married    Spouse name: Not on file   Number of children: Not on file   Years of education: Not on file   Highest education level: Not on file  Occupational History   Not on file  Tobacco Use   Smoking status: Never   Smokeless tobacco: Never  Vaping Use   Vaping Use: Never used  Substance and Sexual Activity   Alcohol use: Yes    Comment: occasionaly   Drug use: Never   Sexual activity: Yes    Birth control/protection: I.U.D.  Other Topics Concern   Not on file  Social History Narrative   ** Merged History Encounter **       Social Determinants of Health   Financial Resource Strain: Not on file  Food Insecurity: Not on file  Transportation Needs: Not on file  Physical Activity: Not on file  Stress: Not on file  Social Connections: Not on file  Intimate Partner Violence: Not on file    FAMILY HISTORY: Family History  Problem Relation Age of Onset   Hypertension Father    Breast cancer Maternal Grandmother    Glaucoma Maternal Grandfather    Prostate cancer Maternal Grandfather    Hypertension Paternal Merchant [employer] officer  ALLERGIES:  is allergic to tuberculin tests, aleve [naproxen], other, and tuberculin ppd.  MEDICATIONS:  Current Outpatient Medications  Medication Sig Dispense Refill   Blood Glucose Monitoring Suppl (CONTOUR NEXT MONITOR) w/Device KIT      Cholecalciferol 50 MCG (2000 UT) TABS Take by mouth.     CONTOUR NEXT TEST test strip      insulin aspart (NOVOLOG) 100 UNIT/ML injection INJECT SUBCUTANEOUSLY VIA INSULIN PUMP UP TO 90 UNITS/DAY     Insulin Disposable Pump (OMNIPOD 5 G6 INTRO, GEN 5,) KIT Inject into the skin.     Lancets (ONETOUCH ULTRASOFT) lancets       levonorgestrel (MIRENA) 20 MCG/DAY IUD by Intrauterine route.     levothyroxine (SYNTHROID) 50 MCG tablet Take 50 mcg by mouth every morning.     ondansetron (ZOFRAN-ODT) 4 MG disintegrating tablet Take 4 mg by mouth every 8 (eight) hours as needed.     OZEMPIC, 0.25 OR 0.5 MG/DOSE, 2 MG/3ML SOPN Inject into the skin.     valACYclovir (VALTREX) 500 MG tablet TAKE 1 TABLET BY MOUTH DAILY. CAN INCREASE TO TWICE A DAY FOR 5 DAYS IN THE EVENT OF A RECURRENCE 90 tablet 0   glucagon 1 MG injection  (Patient not taking: Reported on 05/26/2022)     iron polysaccharides (NIFEREX) 150 MG capsule Take 1 capsule (150 mg total) by mouth daily. (Patient not taking: Reported on 05/26/2022) 30 capsule 2   Current Facility-Administered Medications  Medication Dose Route Frequency Provider Last Rate Last Admin   albuterol (PROVENTIL) (2.5 MG/3ML) 0.083% nebulizer solution 2.5 mg  2.5 mg Nebulization Once Apolonio Schneiders, FNP         PHYSICAL EXAMINATION: ECOG PERFORMANCE STATUS: 0 - Asymptomatic Vitals:   05/26/22 1439  BP: (!) 122/95  Pulse: 78  Resp: 18  Temp: 98.4 F (36.9 C)   Filed Weights   05/26/22 1439  Weight: 217 lb 1.6 oz (98.5 kg)    Physical Exam Constitutional:      General: She is not in acute distress. HENT:     Head: Normocephalic and atraumatic.  Eyes:     General: No scleral icterus. Cardiovascular:     Rate and Rhythm: Normal rate and regular rhythm.     Heart sounds: Normal heart sounds.  Pulmonary:     Effort: Pulmonary effort is normal. No respiratory distress.     Breath sounds: No wheezing.  Abdominal:     General: Bowel sounds are normal. There is no distension.     Palpations: Abdomen is soft.  Musculoskeletal:        General: No deformity. Normal range of motion.     Cervical back: Normal range of motion and neck supple.  Skin:    General: Skin is warm and dry.     Findings: No erythema or rash.  Neurological:     Mental Status: She is alert and oriented to  person, place, and time. Mental status is at baseline.     Cranial Nerves: No cranial nerve deficit.     Coordination: Coordination normal.  Psychiatric:        Mood and Affect: Mood normal.     LABORATORY DATA:  I have reviewed the data as listed    Latest Ref Rng & Units 05/25/2022    2:50 PM 11/21/2021    2:53 PM 08/22/2021    2:23 PM  CBC  WBC 4.0 - 10.5 K/uL 6.5  6.8  7.3   Hemoglobin 12.0 - 15.0 g/dL  12.9  12.1  8.1   Hematocrit 36.0 - 46.0 % 37.5  35.3  27.5   Platelets 150 - 400 K/uL 287  296  415        Latest Ref Rng & Units 12/30/2021    8:02 AM 05/07/2021    1:49 PM  CMP  Glucose 70 - 99 mg/dL 122  275   BUN 6 - 20 mg/dL 17  16   Creatinine 0.57 - 1.00 mg/dL 1.04  1.13   Sodium 134 - 144 mmol/L 139  135   Potassium 3.5 - 5.2 mmol/L 4.7  4.4   Chloride 96 - 106 mmol/L 105  101   CO2 20 - 29 mmol/L 23  20   Calcium 8.7 - 10.2 mg/dL 9.1  9.0   Total Protein 6.0 - 8.5 g/dL  7.1   Total Bilirubin 0.0 - 1.2 mg/dL  <0.2   Alkaline Phos 44 - 121 IU/L  66   AST 0 - 40 IU/L  24   ALT 0 - 32 IU/L  22     Iron/TIBC/Ferritin/ %Sat    Component Value Date/Time   IRON 27 (L) 05/25/2022 1450   IRON 46 04/23/2022 1345   TIBC 277 05/25/2022 1450   TIBC 293 04/23/2022 1345   FERRITIN 73 05/25/2022 1450   FERRITIN 98 04/23/2022 1345   IRONPCTSAT 10 (L) 05/25/2022 1450   IRONPCTSAT 16 04/23/2022 1345      RADIOGRAPHIC STUDIES: I have personally reviewed the radiological images as listed and agreed with the findings in the report. No results found.    ASSESSMENT & PLAN:  1. Other iron deficiency anemia   2. Sickle cell trait ([place])    # Iron deficiency anemia.  Labs reviewed and discussed with Hemoglobin has normalized,  Ferritin has improved. Iron saturation 10%, still decreased. Discussed about oral iron supplementation option versus IV Venofer treatments Patient advised to IV Venofer treatment to achieve higher level of iron.  Proceed with IV Venofer 200 mg  times Etiology of iron deficiency is unclear. She does not have menstrual period blood loss. Bowel obstruction versus GI blood loss etiologies are possible.  Recommend patient to further discuss with her primary care provider.  Sickle cell trait, no need for intervention.  Hemoglobin is stable and normal. Discussed about prenatal genetic counseling if she is planning for family.  Orders Placed This Encounter  Procedures   CBC with Differential/Platelet    Standing Status:   Future    Standing Expiration Date:   05/27/2023   Ferritin    Standing Status:   Future    Standing Expiration Date:   05/27/2023   Iron and TIBC    Standing Status:   Future    Standing Expiration Date:   05/27/2023    All questions were answered. The patient knows to call the clinic with any problems questions or concerns.    Return of visit: 1 year   Earlie Server, MD, PhD 05/26/2022

## 2022-06-05 ENCOUNTER — Encounter: Payer: Self-pay | Admitting: Nurse Practitioner

## 2022-06-05 ENCOUNTER — Ambulatory Visit (INDEPENDENT_AMBULATORY_CARE_PROVIDER_SITE_OTHER): Payer: Self-pay | Admitting: Nurse Practitioner

## 2022-06-05 VITALS — BP 116/70 | Temp 99.1°F | Ht 64.5 in | Wt 213.2 lb

## 2022-06-05 DIAGNOSIS — L509 Urticaria, unspecified: Secondary | ICD-10-CM

## 2022-06-05 MED ORDER — FAMOTIDINE 20 MG PO TABS: 20.0000 mg | ORAL_TABLET | Freq: Two times a day (BID) | ORAL | 2 refills | Status: DC

## 2022-06-05 NOTE — Progress Notes (Signed)
Subjective:    Patient ID: Patricia Ramirez, female    DOB: 1996-07-14, 26 y.o.   MRN: [id number]  HPI  26 year old female presenting to [facility] with complaints of ongoing hives. This started approximately 2-3 months ago on her legs. She has tried Benadryl, Xyzal, Allegra "hives" so far.   Medication changes include starting Ozempic, but patient states legs were itching prior to that.   The rash and itching that started on her legs has now started on her face mainly under her eyes for the past 3 weeks.   Current Outpatient Medications  Medication Instructions   Blood Glucose Monitoring Suppl (CONTOUR NEXT MONITOR) w/Device KIT No dose, route, or frequency recorded.   Cholecalciferol 50 MCG (2000 UT) TABS Oral   CONTOUR NEXT TEST test strip No dose, route, or frequency recorded.   famotidine (PEPCID) 20 mg, Oral, 2 times daily   glucagon 1 MG injection    insulin aspart (NOVOLOG) 100 UNIT/ML injection INJECT SUBCUTANEOUSLY VIA INSULIN PUMP UP TO 90 UNITS/DAY   Insulin Disposable Pump (OMNIPOD 5 G6 INTRO, GEN 5,) KIT Subcutaneous   iron polysaccharides (NIFEREX) 150 mg, Oral, Daily   Lancets (ONETOUCH ULTRASOFT) lancets    levonorgestrel (MIRENA) 20 MCG/DAY IUD Intrauterine   levothyroxine (SYNTHROID) 50 mcg, Every morning   levothyroxine (SYNTHROID) 100 mcg, Oral, Daily before breakfast   ondansetron (ZOFRAN-ODT) 4 mg, Oral, Every 8 hours PRN   OZEMPIC, 0.25 OR 0.5 MG/DOSE, 2 MG/3ML SOPN Subcutaneous   valACYclovir (VALTREX) 500 MG tablet TAKE 1 TABLET BY MOUTH DAILY. CAN INCREASE TO TWICE A DAY FOR 5 DAYS IN THE EVENT OF A RECURRENCE    She has continued to take benadryl three times daily, allegra and Xyzal without relief.   She has been working with her endocrinologist to titrate thyroid medication recently increased Synthroid to 100 after most recent labs:   Recent Results (from the past 2160 hour(s))  Ferritin     Status: None   Collection Time: 04/23/22  1:45  PM  Result Value Ref Range   Ferritin 98 15 - 150 ng/mL  Thyroid Panel With TSH     Status: Abnormal   Collection Time: 04/23/22  1:45 PM  Result Value Ref Range   TSH 9.890 (H) 0.450 - 4.500 uIU/mL   T4, Total 7.7 4.5 - 12.0 ug/dL   T3 Uptake Ratio 25 24 - 39 %   Free Thyroxine Index 1.9 1.2 - 4.9  Iron and TIBC     Status: None   Collection Time: 04/23/22  1:45 PM  Result Value Ref Range   Total Iron Binding Capacity 293 250 - 450 ug/dL   UIBC 247 131 - 425 ug/dL   Iron 46 27 - 159 ug/dL   Iron Saturation 16 15 - 55 %  Specimen status report     Status: None   Collection Time: 04/23/22  1:45 PM  Result Value Ref Range   specimen status report Comment     Comment: Written Authorization Written Authorization Written Authorization Received. Authorization received from ORIGINAL REQUISITION 04-24-2022 Logged by [place] [employer]   Iron and TIBC     Status: Abnormal   Collection Time: 05/25/22  2:50 PM  Result Value Ref Range   Iron 27 (L) 28 - 170 ug/dL   TIBC 277 250 - 450 ug/dL   Saturation Ratios 10 (L) 10.4 - 31.8 %   UIBC 250 ug/dL    Comment: Performed at  [facility], [address] [place], [place] [address]  Ferritin     Status: None   Collection Time: 05/25/22  2:50 PM  Result Value Ref Range   Ferritin 73 11 - 307 ng/mL    Comment: Performed at [facility], [address] [place], [place] [address]  CBC with Differential/Platelet     Status: None   Collection Time: 05/25/22  2:50 PM  Result Value Ref Range   WBC 6.5 4.0 - 10.5 K/uL   RBC 4.67 3.87 - 5.11 MIL/uL   Hemoglobin 12.9 12.0 - 15.0 g/dL   HCT 37.5 36.0 - 46.0 %   MCV 80.3 80.0 - 100.0 fL   MCH 27.6 26.0 - 34.0 pg   MCHC 34.4 30.0 - 36.0 g/dL   RDW 14.6 11.5 - 15.5 %   Platelets 287 150 - 400 K/uL   nRBC 0.0 0.0 - 0.2 %   Neutrophils Relative % 55 %   Neutro Abs 3.6 1.7 - 7.7 K/uL   Lymphocytes Relative 33 %   Lymphs Abs 2.1 0.7 - 4.0 K/uL   Monocytes Relative 10 %    Monocytes Absolute 0.6 0.1 - 1.0 K/uL   Eosinophils Relative 1 %   Eosinophils Absolute 0.1 0.0 - 0.5 K/uL   Basophils Relative 1 %   Basophils Absolute 0.1 0.0 - 0.1 K/uL   Immature Granulocytes 0 %   Abs Immature Granulocytes 0.01 0.00 - 0.07 K/uL    Comment: Performed at [facility], [address] [place], [place] [address]     Today's Vitals   06/05/22 0941  BP: 116/70  Temp: 99.1 F (37.3 C)  TempSrc: Tympanic  Weight: 213 lb 3.2 oz (96.7 kg)  Height: 5' 4.5" (1.638 m)   Body mass index is 36.03 kg/m.  Review of Systems  Constitutional: Negative.   HENT: Negative.    Eyes: Negative.   Respiratory: Negative.    Cardiovascular: Negative.   Skin:  Positive for rash.       Objective:   Physical Exam HENT:     Head: Normocephalic.      Nose: Nose normal.  Pulmonary:     Effort: Pulmonary effort is normal.  Musculoskeletal:     Cervical back: Normal range of motion.  Skin:    Findings: Erythema and rash present. Rash is macular.  Neurological:     Mental Status: She is alert.           Assessment & Plan:  1. Hives Stop Benadryl and keep for as needed/standy use only Steop Alelgra Start Zyrtec at night and   - famotidine (PEPCID) 20 MG tablet; Take 1 tablet (20 mg total) by mouth 2 (two) times daily.  Dispense: 60 tablet; Refill: 2    Avoid hot baths/showers Use only hypoallergenic soap/dove  Use ointment based moisturizers like Aquaphor  Stop topical steroid, may use benadryl topical (avoid face)

## 2022-07-15 ENCOUNTER — Other Ambulatory Visit: Payer: Self-pay | Admitting: Nurse Practitioner

## 2022-07-15 DIAGNOSIS — L509 Urticaria, unspecified: Secondary | ICD-10-CM

## 2022-08-12 DIAGNOSIS — E109 Type 1 diabetes mellitus without complications: Secondary | ICD-10-CM | POA: Diagnosis not present

## 2022-08-17 ENCOUNTER — Other Ambulatory Visit: Payer: Self-pay | Admitting: Obstetrics and Gynecology

## 2022-08-17 DIAGNOSIS — Z8619 Personal history of other infectious and parasitic diseases: Secondary | ICD-10-CM

## 2022-08-18 DIAGNOSIS — Z23 Encounter for immunization: Secondary | ICD-10-CM | POA: Diagnosis not present

## 2022-08-19 DIAGNOSIS — Z9641 Presence of insulin pump (external) (internal): Secondary | ICD-10-CM | POA: Diagnosis not present

## 2022-08-19 DIAGNOSIS — E139 Other specified diabetes mellitus without complications: Secondary | ICD-10-CM | POA: Diagnosis not present

## 2022-08-19 DIAGNOSIS — E109 Type 1 diabetes mellitus without complications: Secondary | ICD-10-CM | POA: Diagnosis not present

## 2022-08-19 DIAGNOSIS — Z136 Encounter for screening for cardiovascular disorders: Secondary | ICD-10-CM | POA: Diagnosis not present

## 2022-08-19 DIAGNOSIS — Z794 Long term (current) use of insulin: Secondary | ICD-10-CM | POA: Diagnosis not present

## 2022-08-20 ENCOUNTER — Encounter: Payer: Self-pay | Admitting: Nurse Practitioner

## 2022-08-21 ENCOUNTER — Encounter: Payer: Self-pay | Admitting: Nurse Practitioner

## 2022-08-21 ENCOUNTER — Ambulatory Visit (INDEPENDENT_AMBULATORY_CARE_PROVIDER_SITE_OTHER): Payer: Self-pay | Admitting: Nurse Practitioner

## 2022-08-21 VITALS — Temp 97.9°F

## 2022-08-21 DIAGNOSIS — R300 Dysuria: Secondary | ICD-10-CM

## 2022-08-21 DIAGNOSIS — B379 Candidiasis, unspecified: Secondary | ICD-10-CM

## 2022-08-21 DIAGNOSIS — T3695XA Adverse effect of unspecified systemic antibiotic, initial encounter: Secondary | ICD-10-CM

## 2022-08-21 MED ORDER — FLUCONAZOLE 150 MG PO TABS: 150.0000 mg | ORAL_TABLET | Freq: Once | ORAL | 0 refills | Status: AC

## 2022-08-21 MED ORDER — NITROFURANTOIN MONOHYD MACRO 100 MG PO CAPS: 100.0000 mg | ORAL_CAPSULE | Freq: Two times a day (BID) | ORAL | 0 refills | Status: AC

## 2022-08-21 NOTE — Progress Notes (Signed)
[facility] [address], [place] [address] [phone]  Office Visit Note  Patient Name: Patricia Ramirez Date of Birth [id number]  Medical Record number [id number]  Date of Service: 08/21/2022     HPI 26 year old female presenting to [facility] with complaints of urinary urgency and frequency with some discomfort and vaginal itching as well.  She did start taking AZO for relief  Denies recent UTIs Has been having some lower back cramping recently not specific to CVA   Current Medication:  Outpatient Encounter Medications as of 08/21/2022  Medication Sig   Blood Glucose Monitoring Suppl (CONTOUR NEXT MONITOR) w/Device KIT    Cholecalciferol 50 MCG (2000 UT) TABS Take by mouth.   CONTOUR NEXT TEST test strip    famotidine (PEPCID) 20 MG tablet TAKE 1 TABLET BY MOUTH TWICE A DAY   glucagon 1 MG injection    insulin aspart (NOVOLOG) 100 UNIT/ML injection INJECT SUBCUTANEOUSLY VIA INSULIN PUMP UP TO 90 UNITS/DAY   Insulin Disposable Pump (OMNIPOD 5 G6 INTRO, GEN 5,) KIT Inject into the skin.   iron polysaccharides (NIFEREX) 150 MG capsule Take 1 capsule (150 mg total) by mouth daily. (Patient not taking: Reported on 05/26/2022)   Lancets (ONETOUCH ULTRASOFT) lancets    levonorgestrel (MIRENA) 20 MCG/DAY IUD by Intrauterine route.   levothyroxine (SYNTHROID) 100 MCG tablet Take 100 mcg by mouth daily before breakfast.   levothyroxine (SYNTHROID) 50 MCG tablet Take 50 mcg by mouth every morning. (Patient not taking: Reported on 06/05/2022)   ondansetron (ZOFRAN-ODT) 4 MG disintegrating tablet Take 4 mg by mouth every 8 (eight) hours as needed.   OZEMPIC, 0.25 OR 0.5 MG/DOSE, 2 MG/3ML SOPN Inject into the skin.   valACYclovir (VALTREX) 500 MG tablet TAKE 1 TABLET BY MOUTH DAILY. CAN INCREASE TO TWICE A DAY FOR 5 DAYS IN THE EVENT OF A RECURRENCE   Facility-Administered Encounter Medications as of 08/21/2022  Medication   albuterol (PROVENTIL) (2.5  MG/3ML) 0.083% nebulizer solution 2.5 mg      Medical History: Past Medical History:  Diagnosis Date   Anemia    Graves' disease in remission    Type 1 diabetes (HCC)      Vital Signs: Today's Vitals   08/21/22 1014  Temp: 97.9 F (36.6 C)   There is no height or weight on file to calculate BMI.   Review of Systems  Constitutional: Negative.  Negative for fever.  HENT: Negative.    Respiratory: Negative.    Genitourinary:  Positive for dysuria, frequency and urgency.  Musculoskeletal: Negative.   Neurological: Negative.     Physical Exam HENT:     Head: Normocephalic.     Nose: Nose normal.  Pulmonary:     Effort: Pulmonary effort is normal.  Abdominal:     Tenderness: There is no right CVA tenderness or left CVA tenderness.  Skin:    General: Skin is warm.  Neurological:     General: No focal deficit present.     Mental Status: She is alert and oriented to person, place, and time. Mental status is at baseline.  Psychiatric:        Mood and Affect: Mood normal.       Assessment/Plan: 1. Dysuria  - Urine Culture pending  Unable to perform urine dip as urine is red colored from AZO use   - nitrofurantoin, macrocrystal-monohydrate, (MACROBID) 100 MG capsule; Take 1 capsule (100 mg total) by mouth 2 (two) times daily for  5 days.  Dispense: 10 capsule; Refill: 0  2. Antibiotic-induced yeast infection  - fluconazole (DIFLUCAN) 150 MG tablet; Take 1 tablet (150 mg total) by mouth once for 1 dose.  Dispense: 1 tablet; Refill: 0    General Counseling: Patricia Ramirez verbalizes understanding of the findings of todays visit and agrees with plan of treatment. I have discussed any further diagnostic evaluation that may be needed or ordered today. We also reviewed her medications today. she has been encouraged to call the office with any questions or concerns that should arise related to todays visit.     Time spent:10 [place] [facility] Family Nurse  Practitioner

## 2022-08-22 DIAGNOSIS — L509 Urticaria, unspecified: Secondary | ICD-10-CM | POA: Diagnosis not present

## 2022-08-22 DIAGNOSIS — N3000 Acute cystitis without hematuria: Secondary | ICD-10-CM | POA: Diagnosis not present

## 2022-08-22 DIAGNOSIS — Z6833 Body mass index (BMI) 33.0-33.9, adult: Secondary | ICD-10-CM | POA: Diagnosis not present

## 2022-08-22 DIAGNOSIS — T50905A Adverse effect of unspecified drugs, medicaments and biological substances, initial encounter: Secondary | ICD-10-CM | POA: Diagnosis not present

## 2022-08-25 LAB — URINE CULTURE

## 2022-08-26 ENCOUNTER — Encounter: Payer: Self-pay | Admitting: Nurse Practitioner

## 2022-08-26 ENCOUNTER — Other Ambulatory Visit: Payer: Self-pay | Admitting: Nurse Practitioner

## 2022-08-26 MED ORDER — FLUCONAZOLE 150 MG PO TABS: 150.0000 mg | ORAL_TABLET | Freq: Once | ORAL | 0 refills | Status: AC

## 2022-08-26 NOTE — Progress Notes (Signed)
Sent one additional Diflucan for ongoing vaginal symptoms and due to result of recent urine test results   Recent Results (from the past 2160 hour(s))  Urine Culture     Status: Abnormal   Collection Time: 08/21/22 10:06 AM   Specimen: Urine   Urine  Release to pati  Result Value Ref Range   Urine Culture, Routine Final report (A)    Organism ID, Bacteria Candida albicans (A)     Comment: 10,000-25,000 colony forming units per mL   ORGANISM ID, BACTERIA Comment     Comment: Mixed urogenital flora 50,000-100,000 colony forming units per mL

## 2022-09-16 ENCOUNTER — Encounter: Payer: Self-pay | Admitting: Nurse Practitioner

## 2022-09-16 ENCOUNTER — Ambulatory Visit (INDEPENDENT_AMBULATORY_CARE_PROVIDER_SITE_OTHER): Payer: Self-pay | Admitting: Nurse Practitioner

## 2022-09-16 VITALS — HR 84 | Temp 98.5°F

## 2022-09-16 DIAGNOSIS — L509 Urticaria, unspecified: Secondary | ICD-10-CM

## 2022-09-16 MED ORDER — HYDROXYZINE HCL 10 MG PO TABS: 10.0000 mg | ORAL_TABLET | Freq: Every day | ORAL | 0 refills | Status: DC

## 2022-09-16 NOTE — Progress Notes (Signed)
[facility] [address], [place] [address] [phone]  Office Visit Note  Patient Name: Patricia Ramirez Date of Birth [id number]  Medical Record number [id number]  Date of Service: 09/16/2022  Chief Complaint  Patient presents with   Follow-up    Hives on face came back. Puffy face a lips are puffy. No breathing issues.      HPI 26 year old female presenting with recurrent episodes of an itchy rash. This most recently occurred in August (see previous notes). At that time she had recently been started on Ozempic by endocrinology.  She has since followed up with her endocrinologist and her thyroid medication has been altered within the past 2 weeks-  With the last episode of rash that involved extremities and face she was switched from allegra to Zyrtec and started on Pepcid twice daily. She has continued that regimen for the past 3 months without an episode until this week.   Her rash today is on her face, mainly under eyes and right ear. She did take a benadryl today about 2-3 hours before her appointment.   Aside from the recent dosage change of her Synthroid no other new medications.   She has done well on Ozempic lowering her A1c and 30lb weight loss.    Current Medication:  Outpatient Encounter Medications as of 09/16/2022  Medication Sig   Blood Glucose Monitoring Suppl (CONTOUR NEXT MONITOR) w/Device KIT    Cholecalciferol 50 MCG (2000 UT) TABS Take by mouth.   CONTOUR NEXT TEST test strip    famotidine (PEPCID) 20 MG tablet TAKE 1 TABLET BY MOUTH TWICE A DAY   glucagon 1 MG injection    hydrOXYzine (ATARAX) 10 MG tablet Take 1 tablet (10 mg total) by mouth at bedtime.   insulin aspart (NOVOLOG) 100 UNIT/ML injection INJECT SUBCUTANEOUSLY VIA INSULIN PUMP UP TO 90 UNITS/DAY   Insulin Disposable Pump (OMNIPOD 5 G6 INTRO, GEN 5,) KIT Inject into the skin.   Lancets (ONETOUCH ULTRASOFT) lancets    levonorgestrel (MIRENA) 20 MCG/DAY IUD by Intrauterine  route.   levothyroxine (SYNTHROID) 100 MCG tablet Take 100 mcg by mouth daily before breakfast.   OZEMPIC, 0.25 OR 0.5 MG/DOSE, 2 MG/3ML SOPN Inject into the skin.   valACYclovir (VALTREX) 500 MG tablet TAKE 1 TABLET BY MOUTH DAILY. CAN INCREASE TO TWICE A DAY FOR 5 DAYS IN THE EVENT OF A RECURRENCE   iron polysaccharides (NIFEREX) 150 MG capsule Take 1 capsule (150 mg total) by mouth daily. (Patient not taking: Reported on 05/26/2022)   levothyroxine (SYNTHROID) 50 MCG tablet Take 50 mcg by mouth every morning. (Patient not taking: Reported on 06/05/2022)   ondansetron (ZOFRAN-ODT) 4 MG disintegrating tablet Take 4 mg by mouth every 8 (eight) hours as needed.   Facility-Administered Encounter Medications as of 09/16/2022  Medication   albuterol (PROVENTIL) (2.5 MG/3ML) 0.083% nebulizer solution 2.5 mg      Medical History: Past Medical History:  Diagnosis Date   Anemia    Graves' disease in remission    Type 1 diabetes (HCC)      Vital Signs: Pulse 84   Temp 98.5 F (36.9 C) (Tympanic)   SpO2 98%    Review of Systems  Constitutional: Negative.   HENT: Negative.    Respiratory: Negative.    Skin:  Positive for rash.    Physical Exam HENT:     Head: Normocephalic.  Pulmonary:     Effort: Pulmonary effort is normal.  Skin:  Findings: Rash present. Rash is urticarial.       Neurological:     General: No focal deficit present.     Mental Status: She is alert.  Psychiatric:        Mood and Affect: Mood normal.       Assessment/Plan: 1. Urticaria morning Zyrtec  Lunch Pepcid Dinner Pepcid Bedtime Hydroxyzine   As needed benadryl   When rash has resolved for 2 days: Stop hydroxyzine Move Zyrtec back to pm  Pepcid am Pepcid pm  Benadryl as needed   Message Endocrinology to ask about Ozempic relation to rash and to notify of current symptoms   If symptoms worsen consider prednisone taper     General Counseling: Katilyn verbalizes understanding of the  findings of todays visit and agrees with plan of treatment. I have discussed any further diagnostic evaluation that may be needed or ordered today. We also reviewed her medications today. she has been encouraged to call the office with any questions or concerns that should arise related to todays visit.     Meds ordered this encounter  Medications   hydrOXYzine (ATARAX) 10 MG tablet    Sig: Take 1 tablet (10 mg total) by mouth at bedtime.    Dispense:  30 tablet    Refill:  0    Time spent:20 [place] [facility] Nurse Practitioner

## 2022-09-16 NOTE — Patient Instructions (Signed)
  New plan:  morning Zyrtec  Lunch Pepcid Dinner Pepcid Bedtime Hydroxyzine   As needed benadryl   When rash has resolved for 2 days: Stop hydroxyzine Move Zyrtec back to pm  Pepcid am Pepcid pm  Benadryl as needed   Message Endocrinology to ask about Ozempic relation to rash and to notify of current symptoms

## 2022-09-17 ENCOUNTER — Other Ambulatory Visit: Payer: Self-pay | Admitting: Nurse Practitioner

## 2022-09-17 ENCOUNTER — Encounter: Payer: Self-pay | Admitting: Nurse Practitioner

## 2022-09-30 ENCOUNTER — Other Ambulatory Visit: Payer: Self-pay | Admitting: Nurse Practitioner

## 2022-10-14 ENCOUNTER — Other Ambulatory Visit: Payer: Self-pay | Admitting: Obstetrics and Gynecology

## 2022-10-14 ENCOUNTER — Other Ambulatory Visit: Payer: Self-pay | Admitting: Nurse Practitioner

## 2022-10-14 DIAGNOSIS — J40 Bronchitis, not specified as acute or chronic: Secondary | ICD-10-CM

## 2022-10-14 DIAGNOSIS — Z8619 Personal history of other infectious and parasitic diseases: Secondary | ICD-10-CM

## 2022-11-04 DIAGNOSIS — E109 Type 1 diabetes mellitus without complications: Secondary | ICD-10-CM | POA: Diagnosis not present

## 2022-11-10 ENCOUNTER — Telehealth: Payer: Self-pay | Admitting: Obstetrics and Gynecology

## 2022-11-10 NOTE — Telephone Encounter (Signed)
Left message for patient to call office to r/s her appt with DJE on 11/30/2022

## 2022-11-19 ENCOUNTER — Other Ambulatory Visit: Payer: Self-pay | Admitting: Nurse Practitioner

## 2022-11-19 DIAGNOSIS — L509 Urticaria, unspecified: Secondary | ICD-10-CM

## 2022-11-24 NOTE — Telephone Encounter (Signed)
I contacted patient via phone.  We were able to rescheduled appointment to 2/21 with DJE

## 2022-11-26 ENCOUNTER — Ambulatory Visit (INDEPENDENT_AMBULATORY_CARE_PROVIDER_SITE_OTHER): Payer: Self-pay | Admitting: Adult Health

## 2022-11-26 VITALS — BP 110/72 | HR 110

## 2022-11-26 DIAGNOSIS — R222 Localized swelling, mass and lump, trunk: Secondary | ICD-10-CM

## 2022-11-26 DIAGNOSIS — M25561 Pain in right knee: Secondary | ICD-10-CM

## 2022-11-26 NOTE — Progress Notes (Signed)
[facility] [address], [place] [address]   Office Visit Note  Patient Name: Patricia Ramirez Date of Birth [id number]  Medical Record number [id number]  Date of Service: 11/26/2022  No chief complaint on file.    HPI Pt is here for a sick visit. She reports about one week ago she started having knee pain.  She reports she did Move apartments a few weeks ago and had some minor pain.  She describes the pain in her right knee, and at the worst it radiated up and down her leg.  She does remember nearly tripping over a curb, and she may have twisted it then. Denies any bruising or swelling. She has been taking 800mg  of Motrin every 6 hours for the last few days.   Patient has noticed a small mass midline of her abdoman that she noticed about one month ago.  It was initially painful, but now is just there.  I   Current Medication:  Outpatient Encounter Medications as of 11/26/2022  Medication Sig   albuterol (VENTOLIN HFA) 108 (90 Base) MCG/ACT inhaler TAKE 2 PUFFS BY MOUTH EVERY 6 HOURS AS NEEDED FOR WHEEZE OR SHORTNESS OF BREATH   Blood Glucose Monitoring Suppl (CONTOUR NEXT MONITOR) w/Device KIT    Cholecalciferol 50 MCG (2000 UT) TABS Take by mouth.   CONTOUR NEXT TEST test strip    famotidine (PEPCID) 20 MG tablet TAKE 1 TABLET BY MOUTH TWICE A DAY   glucagon 1 MG injection    hydrOXYzine (ATARAX) 10 MG tablet TAKE 1 TABLET BY MOUTH EVERYDAY AT BEDTIME   insulin aspart (NOVOLOG) 100 UNIT/ML injection INJECT SUBCUTANEOUSLY VIA INSULIN PUMP UP TO 90 UNITS/DAY   Insulin Disposable Pump (OMNIPOD 5 G6 INTRO, GEN 5,) KIT Inject into the skin.   iron polysaccharides (NIFEREX) 150 MG capsule Take 1 capsule (150 mg total) by mouth daily. (Patient not taking: Reported on 05/26/2022)   Lancets (ONETOUCH ULTRASOFT) lancets    levonorgestrel (MIRENA) 20 MCG/DAY IUD by Intrauterine route.   levothyroxine (SYNTHROID) 100 MCG tablet Take 100 mcg by mouth daily before breakfast.    levothyroxine (SYNTHROID) 50 MCG tablet Take 50 mcg by mouth every morning. (Patient not taking: Reported on 06/05/2022)   ondansetron (ZOFRAN-ODT) 4 MG disintegrating tablet Take 4 mg by mouth every 8 (eight) hours as needed.   OZEMPIC, 0.25 OR 0.5 MG/DOSE, 2 MG/3ML SOPN Inject into the skin.   valACYclovir (VALTREX) 500 MG tablet TAKE 1 TABLET BY MOUTH DAILY. CAN INCREASE TO TWICE A DAY FOR 5 DAYS IN THE EVENT OF A RECURRENCE   Facility-Administered Encounter Medications as of 11/26/2022  Medication   albuterol (PROVENTIL) (2.5 MG/3ML) 0.083% nebulizer solution 2.5 mg      Medical History: Past Medical History:  Diagnosis Date   Anemia    Graves' disease in remission    Type 1 diabetes (HCC)      Vital Signs: BP 110/72 (BP Location: Right Arm, Patient Position: Sitting, Cuff Size: Normal)   Pulse (!) 110    Review of Systems  Constitutional:  Negative for chills, fatigue and fever.  Musculoskeletal:  Positive for arthralgias.       Right knee pain with movement, and sore with extended use.     Physical Exam Vitals and nursing note reviewed.  Constitutional:      Appearance: Normal appearance.  HENT:     Head: Normocephalic.  Abdominal:     Comments: Small nodule/mass midline down low near pubis.  Musculoskeletal:     Comments: Right knee point tenderness over LCL and MCL, more on MCL.  No swelling, redness or bruising noted.  Neurological:     Mental Status: She is alert.     Assessment/Plan: 1. Acute pain of right knee Discussed R.I.C.E.  Rest, Ice 20 minutes on, with at least two hours of off time between icings.  Use a barrier between skin and ice.  Take Ibuprofen 600-800mg  ever 6-8 hours for inflammation.  Compression with Ace-Wrap or brace.  Keep elevated whenever possible.   Follow up via MyChart messenger if symptoms fail to improve or may return to clinic as needed for worsening symptoms.  Discussed [facility] if symptoms persist.   2. Mass of skin of  abdomen She will see OBGYN next month.  Will discuss with them.  If needed, we can order imaging.      General Counseling: Deem verbalizes understanding of the findings of todays visit and agrees with plan of treatment. I have discussed any further diagnostic evaluation that may be needed or ordered today. We also reviewed her medications today. she has been encouraged to call the office with any questions or concerns that should arise related to todays visit.   No orders of the defined types were placed in this encounter.   No orders of the defined types were placed in this encounter.   Time spent:20 Minutes    Kendell Bane AGNP-C Nurse Practitioner

## 2022-11-30 ENCOUNTER — Encounter: Payer: BC Managed Care – PPO | Admitting: Obstetrics and Gynecology

## 2022-12-08 DIAGNOSIS — Z794 Long term (current) use of insulin: Secondary | ICD-10-CM | POA: Diagnosis not present

## 2022-12-08 DIAGNOSIS — Z87898 Personal history of other specified conditions: Secondary | ICD-10-CM | POA: Diagnosis not present

## 2022-12-08 DIAGNOSIS — R202 Paresthesia of skin: Secondary | ICD-10-CM | POA: Diagnosis not present

## 2022-12-08 DIAGNOSIS — D509 Iron deficiency anemia, unspecified: Secondary | ICD-10-CM | POA: Diagnosis not present

## 2022-12-08 DIAGNOSIS — Z13220 Encounter for screening for lipoid disorders: Secondary | ICD-10-CM | POA: Diagnosis not present

## 2022-12-08 DIAGNOSIS — Z8669 Personal history of other diseases of the nervous system and sense organs: Secondary | ICD-10-CM | POA: Diagnosis not present

## 2022-12-08 DIAGNOSIS — E139 Other specified diabetes mellitus without complications: Secondary | ICD-10-CM | POA: Diagnosis not present

## 2022-12-08 DIAGNOSIS — E039 Hypothyroidism, unspecified: Secondary | ICD-10-CM | POA: Diagnosis not present

## 2022-12-08 DIAGNOSIS — Z118 Encounter for screening for other infectious and parasitic diseases: Secondary | ICD-10-CM | POA: Diagnosis not present

## 2022-12-08 DIAGNOSIS — Z114 Encounter for screening for human immunodeficiency virus [HIV]: Secondary | ICD-10-CM | POA: Diagnosis not present

## 2022-12-23 ENCOUNTER — Encounter: Payer: Self-pay | Admitting: Obstetrics and Gynecology

## 2022-12-23 ENCOUNTER — Ambulatory Visit (INDEPENDENT_AMBULATORY_CARE_PROVIDER_SITE_OTHER): Payer: BC Managed Care – PPO | Admitting: Obstetrics and Gynecology

## 2022-12-23 VITALS — BP 117/84 | HR 87 | Ht 64.5 in | Wt 196.6 lb

## 2022-12-23 DIAGNOSIS — Z30431 Encounter for routine checking of intrauterine contraceptive device: Secondary | ICD-10-CM

## 2022-12-23 DIAGNOSIS — Z01419 Encounter for gynecological examination (general) (routine) without abnormal findings: Secondary | ICD-10-CM | POA: Diagnosis not present

## 2022-12-23 NOTE — Progress Notes (Signed)
Patients presents for annual exam today. She states occasional spotting and cramping with IUD, originally placed in 2017.  Patient states the past few months experiencing vaginal dryness with intercourse.She is up to date on pap smear.  Patient states no other questions or concerns at this time.

## 2022-12-23 NOTE — Progress Notes (Signed)
HPI:      Patricia Ramirez is a 27 y.o. G0P0000 who LMP was No LMP recorded. (Menstrual status: IUD).  Subjective:   She presents today for her annual examination.  She states that she has noted a small amount of vaginal dryness.  She says is not a great issue to her as long as is not a sign of something else. She would also like her IUD strings checked.  She is in the middle of her seventh year of Mirena use and has noticed some increased cramping and bleeding approximately 1 to 2 days/month.     Hx: The following portions of the patient's history were reviewed and updated as appropriate:             She  has a past medical history of Anemia, Graves' disease in remission, and Type 1 diabetes ([place]). She does not have any pertinent problems on file. She  has a past surgical history that includes Colonoscopy (2004) and Wisdom tooth extraction (2019). Her family history includes Breast cancer in her maternal grandmother; Glaucoma in her maternal grandfather; Hypertension in her father and paternal grandfather; Prostate cancer in her maternal grandfather. She  reports that she has never smoked. She has never used smokeless tobacco. She reports current alcohol use. She reports that she does not use drugs. She has a current medication list which includes the following prescription(s): albuterol, contour next monitor, cholecalciferol, contour next test, famotidine, glucagon, hydroxyzine, insulin aspart, omnipod 5 g6 intro (gen 5), onetouch ultrasoft, levonorgestrel, levothyroxine, levothyroxine, ondansetron, ozempic (0.25 or 0.5 mg/dose), and valacyclovir, and the following Facility-Administered Medications: albuterol. She is allergic to tuberculin tests, aleve [naproxen], other, and tuberculin ppd.       Review of Systems:  Review of Systems  Constitutional: Denied constitutional symptoms, night sweats, recent illness, fatigue, fever, insomnia and weight loss.  Eyes: Denied eye symptoms, eye pain,  photophobia, vision change and visual disturbance.  Ears/Nose/Throat/Neck: Denied ear, nose, throat or neck symptoms, hearing loss, nasal discharge, sinus congestion and sore throat.  Cardiovascular: Denied cardiovascular symptoms, arrhythmia, chest pain/pressure, edema, exercise intolerance, orthopnea and palpitations.  Respiratory: Denied pulmonary symptoms, asthma, pleuritic pain, productive sputum, cough, dyspnea and wheezing.  Gastrointestinal: Denied, gastro-esophageal reflux, melena, nausea and vomiting.  Genitourinary: Denied genitourinary symptoms including symptomatic vaginal discharge, pelvic relaxation issues, and urinary complaints.  Musculoskeletal: Denied musculoskeletal symptoms, stiffness, swelling, muscle weakness and myalgia.  Dermatologic: Denied dermatology symptoms, rash and scar.  Neurologic: Denied neurology symptoms, dizziness, headache, neck pain and syncope.  Psychiatric: Denied psychiatric symptoms, anxiety and depression.  Endocrine: Denied endocrine symptoms including hot flashes and night sweats.   Meds:   Current Outpatient Medications on File Prior to Visit  Medication Sig Dispense Refill   albuterol (VENTOLIN HFA) 108 (90 Base) MCG/ACT inhaler TAKE 2 PUFFS BY MOUTH EVERY 6 HOURS AS NEEDED FOR WHEEZE OR SHORTNESS OF BREATH 8.5 each 1   Blood Glucose Monitoring Suppl (CONTOUR NEXT MONITOR) w/Device KIT      Cholecalciferol 50 MCG (2000 UT) TABS Take by mouth.     CONTOUR NEXT TEST test strip      famotidine (PEPCID) 20 MG tablet TAKE 1 TABLET BY MOUTH TWICE A DAY 180 tablet 1   glucagon 1 MG injection      hydrOXYzine (ATARAX) 10 MG tablet TAKE 1 TABLET BY MOUTH EVERYDAY AT BEDTIME 30 tablet 0   insulin aspart (NOVOLOG) 100 UNIT/ML injection INJECT SUBCUTANEOUSLY VIA INSULIN PUMP UP TO 90 UNITS/DAY  Insulin Disposable Pump (OMNIPOD 5 G6 INTRO, GEN 5,) KIT Inject into the skin.     Lancets (ONETOUCH ULTRASOFT) lancets      levonorgestrel (MIRENA) 20 MCG/DAY  IUD by Intrauterine route.     levothyroxine (SYNTHROID) 100 MCG tablet Take 100 mcg by mouth daily before breakfast.     levothyroxine (SYNTHROID) 50 MCG tablet Take 50 mcg by mouth every morning.     ondansetron (ZOFRAN-ODT) 4 MG disintegrating tablet Take 4 mg by mouth every 8 (eight) hours as needed.     OZEMPIC, 0.25 OR 0.5 MG/DOSE, 2 MG/3ML SOPN Inject into the skin.     valACYclovir (VALTREX) 500 MG tablet TAKE 1 TABLET BY MOUTH DAILY. CAN INCREASE TO TWICE A DAY FOR 5 DAYS IN THE EVENT OF A RECURRENCE 90 tablet 0   Current Facility-Administered Medications on File Prior to Visit  Medication Dose Route Frequency Provider Last Rate Last Admin   albuterol (PROVENTIL) (2.5 MG/3ML) 0.083% nebulizer solution 2.5 mg  2.5 mg Nebulization Once Apolonio Schneiders, FNP         Objective:     Vitals:   12/23/22 1432  BP: 117/84  Pulse: 87    Filed Weights   12/23/22 1432  Weight: 196 lb 9.6 oz (89.2 kg)              Physical examination General NAD, Conversant  HEENT Atraumatic; Op clear with mmm.  Normo-cephalic. Pupils reactive. Anicteric sclerae  Thyroid/Neck Smooth without nodularity or enlargement. Normal ROM.  Neck Supple.  Skin No rashes, lesions or ulceration. Normal palpated skin turgor. No nodularity.  Breasts: No masses or discharge.  Symmetric.  No axillary adenopathy.  Lungs: Clear to auscultation.No rales or wheezes. Normal Respiratory effort, no retractions.  Heart: NSR.  No murmurs or rubs appreciated. No peripheral edema  Abdomen: Soft.  Non-tender.  No masses.  No HSM. No hernia  Extremities: Moves all appropriately.  Normal ROM for age. No lymphadenopathy.  Neuro: Oriented to PPT.  Normal mood. Normal affect.     Pelvic:   Vulva: Normal appearance.  No lesions.  Vagina: No lesions or abnormalities noted.  Support: Normal pelvic support.  Urethra No masses tenderness or scarring.  Meatus Normal size without lesions or prolapse.  Cervix: Normal appearance.  No  lesions.  IUD strings noted 1 cm outside of cervical os  Anus: Normal exam.  No lesions.  Perineum: Normal exam.  No lesions.        Bimanual   Uterus: Top normal size  Non-tender.  Mobile.  AV.  Adnexae: No masses.  Non-tender to palpation.  Cul-de-sac: Negative for abnormality.     Assessment:    G0P0000 Patient Active Problem List   Diagnosis Date Noted   Sickle cell trait ([place]) 11/25/2021   RBC microcytosis 05/16/2021   Iron deficiency anemia 05/16/2021   Abnormal uterine bleeding (AUB) 08/19/2020   IUD (intrauterine device) in place 08/28/2019   Insulin pump status 01/13/2019   Graves disease 12/14/2018   Uncontrolled type 1 diabetes mellitus with hyperglycemia ([place]) 12/14/2018   Herpes simplex infection 10/29/2017   Anemia 06/05/2014   Type 1 diabetes mellitus ([place]) 06/05/2014     1. Well woman exam with routine gynecological exam   2. Encounter for routine checking of intrauterine contraceptive device (IUD)     Normal exam  IUD correctly positioned   Plan:            1.  Basic Screening Recommendations The basic screening recommendations  for asymptomatic women were discussed with the patient during her visit.  The age-appropriate recommendations were discussed with her and the rational for the tests reviewed.  When I am informed by the patient that another primary care physician has previously obtained the age-appropriate tests and they are up-to-date, only outstanding tests are ordered and referrals given as necessary.  Abnormal results of tests will be discussed with her when all of her results are completed.  Routine preventative health maintenance measures emphasized: Exercise/Diet/Weight control, Tobacco Warnings, Alcohol/Substance use risks and Stress Management 2.  Patient would like to come in within the next 6 months to get her IUD switched -does not desire childbearing. 3.  Vaginal dryness discussed  Orders No orders of the defined types were placed in  this encounter.   No orders of the defined types were placed in this encounter.         F/U  Return in about 1 year (around 12/24/2023) for Annual Physical.  Finis Bud, M.D. 12/23/2022 2:46 PM

## 2022-12-25 ENCOUNTER — Other Ambulatory Visit: Payer: BC Managed Care – PPO

## 2022-12-25 DIAGNOSIS — E1165 Type 2 diabetes mellitus with hyperglycemia: Secondary | ICD-10-CM

## 2022-12-25 DIAGNOSIS — E039 Hypothyroidism, unspecified: Secondary | ICD-10-CM

## 2022-12-25 DIAGNOSIS — Z794 Long term (current) use of insulin: Secondary | ICD-10-CM

## 2022-12-25 DIAGNOSIS — Z13220 Encounter for screening for lipoid disorders: Secondary | ICD-10-CM

## 2022-12-27 LAB — MICROALBUMIN / CREATININE URINE RATIO
Creatinine, Urine: 144.9 mg/dL
Microalb/Creat Ratio: 4 mg/g creat (ref 0–29)
Microalbumin, Urine: 6.3 ug/mL

## 2022-12-27 LAB — LIPID PANEL
Chol/HDL Ratio: 3 ratio (ref 0.0–4.4)
Cholesterol, Total: 222 mg/dL — ABNORMAL HIGH (ref 100–199)
HDL: 73 mg/dL (ref >39)
LDL Chol Calc (NIH): 134 mg/dL — ABNORMAL HIGH (ref 0–99)
Triglycerides: 86 mg/dL (ref 0–149)
VLDL Cholesterol Cal: 15 mg/dL (ref 5–40)

## 2022-12-27 LAB — TSH+FREE T4
Free T4: 1.25 ng/dL (ref 0.82–1.77)
TSH: 6.7 u[IU]/mL — ABNORMAL HIGH (ref 0.450–4.500)

## 2022-12-27 LAB — BASIC METABOLIC PANEL
BUN/Creatinine Ratio: 13 (ref 9–23)
BUN: 17 mg/dL (ref 6–20)
CO2: 22 mmol/L (ref 20–29)
Calcium: 10.1 mg/dL (ref 8.7–10.2)
Chloride: 102 mmol/L (ref 96–106)
Creatinine, Ser: 1.32 mg/dL — ABNORMAL HIGH (ref 0.57–1.00)
Glucose: 123 mg/dL — ABNORMAL HIGH (ref 70–99)
Potassium: 4.4 mmol/L (ref 3.5–5.2)
Sodium: 137 mmol/L (ref 134–144)
eGFR: 56 mL/min/{1.73_m2} — ABNORMAL LOW (ref >59)

## 2023-01-05 ENCOUNTER — Other Ambulatory Visit: Payer: Self-pay

## 2023-01-05 ENCOUNTER — Ambulatory Visit (INDEPENDENT_AMBULATORY_CARE_PROVIDER_SITE_OTHER): Payer: Self-pay | Admitting: Physician Assistant

## 2023-01-05 VITALS — BP 126/82 | HR 109 | Temp 98.2°F | Wt 191.8 lb

## 2023-01-05 DIAGNOSIS — R112 Nausea with vomiting, unspecified: Secondary | ICD-10-CM

## 2023-01-05 MED ORDER — ONDANSETRON HCL 8 MG PO TABS: 8.0000 mg | ORAL_TABLET | Freq: Three times a day (TID) | ORAL | 0 refills | Status: DC | PRN

## 2023-01-05 NOTE — Progress Notes (Signed)
[facility] [address], [place] [address]   Office Visit Note  Patient Name: Patricia Ramirez Date of Birth [id number]  Medical Record number [id number]  Date of Service: 01/05/2023  Chief Complaint  Patient presents with   Nausea    Since 01/02/23   Emesis    Last episode 01/04/23     27 y/o IDDM F presents to the clinic for c/o initially nausea and vomiting over the past 3 days. Random onset of nausea. Had 4-5 episodes of vomiting since Saturday, but not for over 24 hours. Denies diarrhea. Denies any changes in medications, supplements, vitamins, unusual diet, or international travel. Has an IUD. Pt denies any open water activities recently. Patient monitors her blood sugar very closely since she is insulin dependent diabetic.  +h/o migraines controlled with sitting in a dark room. Had a migraine that started on Saturday, but after the onset of nausea. Denies abdominal pain. Denies urinary symptoms.   Emesis  Pertinent negatives include no abdominal pain or diarrhea.      Current Medication:  Outpatient Encounter Medications as of 01/05/2023  Medication Sig   famotidine (PEPCID) 20 MG tablet TAKE 1 TABLET BY MOUTH TWICE A DAY   glucagon 1 MG injection    insulin aspart (NOVOLOG) 100 UNIT/ML injection INJECT SUBCUTANEOUSLY VIA INSULIN PUMP UP TO 90 UNITS/DAY   Insulin Disposable Pump (OMNIPOD 5 G6 INTRO, GEN 5,) KIT Inject into the skin.   levonorgestrel (MIRENA) 20 MCG/DAY IUD by Intrauterine route.   levothyroxine (SYNTHROID) 100 MCG tablet Take 100 mcg by mouth daily before breakfast.   levothyroxine (SYNTHROID) 50 MCG tablet Take 50 mcg by mouth every morning.   ondansetron (ZOFRAN) 8 MG tablet Take 1 tablet (8 mg total) by mouth every 8 (eight) hours as needed for nausea or vomiting.   ondansetron (ZOFRAN-ODT) 4 MG disintegrating tablet Take 4 mg by mouth every 8 (eight) hours as needed.   albuterol (VENTOLIN HFA) 108 (90 Base) MCG/ACT inhaler TAKE 2 PUFFS BY MOUTH  EVERY 6 HOURS AS NEEDED FOR WHEEZE OR SHORTNESS OF BREATH   Blood Glucose Monitoring Suppl (CONTOUR NEXT MONITOR) w/Device KIT    Cholecalciferol 50 MCG (2000 UT) TABS Take by mouth.   CONTOUR NEXT TEST test strip    hydrOXYzine (ATARAX) 10 MG tablet TAKE 1 TABLET BY MOUTH EVERYDAY AT BEDTIME   Lancets (ONETOUCH ULTRASOFT) lancets    OZEMPIC, 0.25 OR 0.5 MG/DOSE, 2 MG/3ML SOPN Inject into the skin.   valACYclovir (VALTREX) 500 MG tablet TAKE 1 TABLET BY MOUTH DAILY. CAN INCREASE TO TWICE A DAY FOR 5 DAYS IN THE EVENT OF A RECURRENCE   Facility-Administered Encounter Medications as of 01/05/2023  Medication   albuterol (PROVENTIL) (2.5 MG/3ML) 0.083% nebulizer solution 2.5 mg      Medical History: Past Medical History:  Diagnosis Date   Anemia    Graves' disease in remission    Type 1 diabetes (HCC)      Vital Signs: BP 126/82 (BP Location: Right Arm, Patient Position: Sitting)   Pulse (!) 109   Temp 98.2 F (36.8 C) (Tympanic)   Wt 191 lb 12.8 oz (87 kg)   SpO2 98%   BMI 32.41 kg/m    Review of Systems  Constitutional: Negative.   HENT: Negative.    Respiratory: Negative.    Cardiovascular: Negative.   Gastrointestinal:  Positive for nausea and vomiting. Negative for abdominal distention, abdominal pain, constipation and diarrhea.  Genitourinary: Negative.   Musculoskeletal: Negative.  Neurological: Negative.   Psychiatric/Behavioral: Negative.      Physical Exam Constitutional:      Appearance: Normal appearance. She is normal weight.  HENT:     Head: Atraumatic.     Right Ear: Tympanic membrane, ear canal and external ear normal.     Left Ear: Tympanic membrane, ear canal and external ear normal.     Nose: Nose normal.     Mouth/Throat:     Mouth: Mucous membranes are moist.     Pharynx: Oropharynx is clear.  Eyes:     Extraocular Movements: Extraocular movements intact.  Cardiovascular:     Rate and Rhythm: Normal rate and regular rhythm.     Heart  sounds: Normal heart sounds.  Pulmonary:     Effort: Pulmonary effort is normal.     Breath sounds: Normal breath sounds.  Abdominal:     General: Abdomen is flat. Bowel sounds are normal. There is no distension.     Palpations: Abdomen is soft.     Tenderness: There is no abdominal tenderness. There is no right CVA tenderness, left CVA tenderness, guarding or rebound.  Musculoskeletal:     Cervical back: Neck supple.  Skin:    General: Skin is warm.  Neurological:     Mental Status: She is alert and oriented to person, place, and time.  Psychiatric:        Mood and Affect: Mood normal.        Behavior: Behavior normal.        Thought Content: Thought content normal.        Judgment: Judgment normal.       Assessment/Plan:  1. Nausea and vomiting, unspecified vomiting type - CBC with Differential/Platelet - Comprehensive metabolic panel - ondansetron (ZOFRAN) 8 MG tablet; Take 1 tablet (8 mg total) by mouth every 8 (eight) hours as needed for nausea or vomiting.  Dispense: 20 tablet; Refill: 0  Reviewed my clinical findings with patient.  Stay well hydrated with clear liquids.  Increase diet to BRAT if tolerated then slowly increase to regular diet.  Take medicine as prescribed. If symptoms don't improve or worsen then go to UC or ER.  Follow up with PCP for further evaluation.   General Counseling: Seda verbalizes understanding of the findings of todays visit and agrees with plan of treatment. I have discussed any further diagnostic evaluation that may be needed or ordered today. We also reviewed her medications today. she has been encouraged to call the office with any questions or concerns that should arise related to todays visit.    Time spent:20 [place], [place] Physician Assistant

## 2023-01-07 LAB — COMPREHENSIVE METABOLIC PANEL
ALT: 28 IU/L (ref 0–32)
AST: 31 IU/L (ref 0–40)
Albumin/Globulin Ratio: 1.3 (ref 1.2–2.2)
Albumin: 4.5 g/dL (ref 4.0–5.0)
Alkaline Phosphatase: 66 IU/L (ref 44–121)
BUN/Creatinine Ratio: 11 (ref 9–23)
BUN: 14 mg/dL (ref 6–20)
Bilirubin Total: 0.3 mg/dL (ref 0.0–1.2)
CO2: 14 mmol/L — ABNORMAL LOW (ref 20–29)
Calcium: 9.9 mg/dL (ref 8.7–10.2)
Chloride: 106 mmol/L (ref 96–106)
Creatinine, Ser: 1.29 mg/dL — ABNORMAL HIGH (ref 0.57–1.00)
Globulin, Total: 3.4 g/dL (ref 1.5–4.5)
Glucose: 95 mg/dL (ref 70–99)
Potassium: 4.8 mmol/L (ref 3.5–5.2)
Sodium: 142 mmol/L (ref 134–144)
Total Protein: 7.9 g/dL (ref 6.0–8.5)
eGFR: 58 mL/min/{1.73_m2} — ABNORMAL LOW (ref >59)

## 2023-01-07 LAB — CBC WITH DIFFERENTIAL/PLATELET
Basophils Absolute: 0 10*3/uL (ref 0.0–0.2)
Basos: 0 %
EOS (ABSOLUTE): 0.1 10*3/uL (ref 0.0–0.4)
Eos: 1 %
Hematocrit: 40.2 % (ref 34.0–46.6)
Hemoglobin: 13.6 g/dL (ref 11.1–15.9)
Immature Grans (Abs): 0 10*3/uL (ref 0.0–0.1)
Immature Granulocytes: 0 %
Lymphocytes Absolute: 2 10*3/uL (ref 0.7–3.1)
Lymphs: 20 %
MCH: 29.3 pg (ref 26.6–33.0)
MCHC: 33.8 g/dL (ref 31.5–35.7)
MCV: 87 fL (ref 79–97)
Monocytes Absolute: 0.8 10*3/uL (ref 0.1–0.9)
Monocytes: 8 %
Neutrophils Absolute: 7.3 10*3/uL — ABNORMAL HIGH (ref 1.4–7.0)
Neutrophils: 71 %
Platelets: 284 10*3/uL (ref 150–450)
RBC: 4.64 x10E6/uL (ref 3.77–5.28)
RDW: 12.8 % (ref 11.7–15.4)
WBC: 10.2 10*3/uL (ref 3.4–10.8)

## 2023-01-11 ENCOUNTER — Telehealth: Payer: Self-pay | Admitting: Physician Assistant

## 2023-01-11 NOTE — Telephone Encounter (Signed)
Discussed the abnormal creatinine and GFR with patient. Recommend to f.u with Nephrologist. Pt recommended to see PCP and if referral needed for Nephrologist for further evaluation and management. Pt verbalized understanding and in agreement.

## 2023-01-11 NOTE — Telephone Encounter (Signed)
Left a message to return my call to review test results.

## 2023-01-19 DIAGNOSIS — R200 Anesthesia of skin: Secondary | ICD-10-CM | POA: Diagnosis not present

## 2023-01-21 ENCOUNTER — Other Ambulatory Visit: Payer: Self-pay

## 2023-01-21 DIAGNOSIS — Z8619 Personal history of other infectious and parasitic diseases: Secondary | ICD-10-CM

## 2023-02-03 DIAGNOSIS — E109 Type 1 diabetes mellitus without complications: Secondary | ICD-10-CM | POA: Diagnosis not present

## 2023-04-08 DIAGNOSIS — Z87898 Personal history of other specified conditions: Secondary | ICD-10-CM | POA: Diagnosis not present

## 2023-04-08 DIAGNOSIS — Z8669 Personal history of other diseases of the nervous system and sense organs: Secondary | ICD-10-CM | POA: Diagnosis not present

## 2023-04-08 DIAGNOSIS — R200 Anesthesia of skin: Secondary | ICD-10-CM | POA: Diagnosis not present

## 2023-04-08 DIAGNOSIS — E569 Vitamin deficiency, unspecified: Secondary | ICD-10-CM | POA: Diagnosis not present

## 2023-04-14 NOTE — Progress Notes (Signed)
BP 120/72   Pulse 100   Temp 98 F (36.7 C) (Oral)   Resp 16   Wt 185 lb (83.9 kg)   SpO2 99%   BMI 31.26 kg/m    Subjective:    Patient ID: Patricia Ramirez, female    DOB: 01/04/1996, 27 y.o.   MRN: [id number]  HPI: Patricia Ramirez is a 27 y.o. female  Chief Complaint  Patient presents with   Establish Care   Diabetes   Gastroesophageal Reflux   Establish care: her last physical was a while ago.  Medical history includes listed below.  Family history includes hashimoto, heart disease, asthma.  Health maintenance up to date on labs, pap.   Diabetes, Type 1: she is managed by endocrinology Dr. Ninfa Linden, last seen on 12/08/2022. -Last A1c 7.0 on 08/19/2022 -Medications:  novolog insulin pump, ozempic -Patient is compliant with the above medications and reports no side effects.  -Checking BG at home: uses dexacom, readings 80-220 -Eye exam: done in 2023, per endo note -Foot exam: done 08/2022 per endo note -Microalbumin: up to date -Statin: not currently -Denies symptoms of hypoglycemia, polyuria, polydipsia, numbness extremities, foot ulcers/trauma.    CKD:  -CKD status: worse -Last Creatinine: 1.29 -Medications renally dose: no -Previous renal evaluation: no -Pneumovax:  Not up to Date -Influenza Vaccine:  Not up to Date  - referral placed to nephrology  Hypothyroidism/hashimoto  she is managed by endocrinology Dr. Ninfa Linden, last seen on 12/08/2022. Medications: levothyroxine 75 mcg daily  Current symptoms: none .  Patient denies change in energy level, diarrhea, heat / cold intolerance, nervousness, and palpitations.  Symptoms have been intermittent.   Iron deficiency anemia/sickle cell trait: Currently seeing hematology Has received iron infusions She follows up with them in July She denies any abnormal bleeding She has an IUD  HIVES: She gets hives occasionally.  She takes hydroxyzine and pepcid when she has a flare.  She reports that it is associated to her  hashimoto.    Relevant past medical, surgical, family and social history reviewed and updated as indicated. Interim medical history since our last visit reviewed. Allergies and medications reviewed and updated.  Review of Systems  Constitutional: Negative for fever or weight change.  Respiratory: Negative for cough and shortness of breath.   Cardiovascular: Negative for chest pain or palpitations.  Gastrointestinal: Negative for abdominal pain, no bowel changes.  Musculoskeletal: Negative for gait problem or joint swelling.  Skin: Negative for rash.  Neurological: Negative for dizziness or headache.  No other specific complaints in a complete review of systems (except as listed in HPI above).      Objective:    BP 120/72   Pulse 100   Temp 98 F (36.7 C) (Oral)   Resp 16   Wt 185 lb (83.9 kg)   SpO2 99%   BMI 31.26 kg/m   Wt Readings from Last 3 Encounters:  04/15/23 185 lb (83.9 kg)  01/05/23 191 lb 12.8 oz (87 kg)  12/23/22 196 lb 9.6 oz (89.2 kg)    Physical Exam  Constitutional: Patient appears well-developed and well-nourished. Obese  No distress.  HEENT: head atraumatic, normocephalic, pupils equal and reactive to light, neck supple Cardiovascular: Normal rate, regular rhythm and normal heart sounds.  No murmur heard. No BLE edema. Pulmonary/Chest: Effort normal and breath sounds normal. No respiratory distress. Abdominal: Soft.  There is no tenderness. Psychiatric: Patient has a normal mood and affect. behavior is normal. Judgment and thought content normal.  Results for  orders placed or performed in visit on 01/05/23  CBC with Differential/Platelet  Result Value Ref Range   WBC 10.2 3.4 - 10.8 x10E3/uL   RBC 4.64 3.77 - 5.28 x10E6/uL   Hemoglobin 13.6 11.1 - 15.9 g/dL   Hematocrit 16.1 09.6 - 46.6 %   MCV 87 79 - 97 fL   MCH 29.3 26.6 - 33.0 pg   MCHC 33.8 31.5 - 35.7 g/dL   RDW 04.5 40.9 - 81.1 %   Platelets 284 150 - 450 x10E3/uL   Neutrophils 71 Not  Estab. %   Lymphs 20 Not Estab. %   Monocytes 8 Not Estab. %   Eos 1 Not Estab. %   Basos 0 Not Estab. %   Neutrophils Absolute 7.3 (H) 1.4 - 7.0 x10E3/uL   Lymphocytes Absolute 2.0 0.7 - 3.1 x10E3/uL   Monocytes Absolute 0.8 0.1 - 0.9 x10E3/uL   EOS (ABSOLUTE) 0.1 0.0 - 0.4 x10E3/uL   Basophils Absolute 0.0 0.0 - 0.2 x10E3/uL   Immature Granulocytes 0 Not Estab. %   Immature Grans (Abs) 0.0 0.0 - 0.1 x10E3/uL  Comprehensive metabolic panel  Result Value Ref Range   Glucose 95 70 - 99 mg/dL   BUN 14 6 - 20 mg/dL   Creatinine, Ser 9.14 (H) 0.57 - 1.00 mg/dL   eGFR 58 (L) >78 GN/FAO/1.30   BUN/Creatinine Ratio 11 9 - 23   Sodium 142 134 - 144 mmol/L   Potassium 4.8 3.5 - 5.2 mmol/L   Chloride 106 96 - 106 mmol/L   CO2 14 (L) 20 - 29 mmol/L   Calcium 9.9 8.7 - 10.2 mg/dL   Total Protein 7.9 6.0 - 8.5 g/dL   Albumin 4.5 4.0 - 5.0 g/dL   Globulin, Total 3.4 1.5 - 4.5 g/dL   Albumin/Globulin Ratio 1.3 1.2 - 2.2   Bilirubin Total 0.3 0.0 - 1.2 mg/dL   Alkaline Phosphatase 66 44 - 121 IU/L   AST 31 0 - 40 IU/L   ALT 28 0 - 32 IU/L      Assessment & Plan:   Problem List Items Addressed This Visit       Digestive   RESOLVED: Gastroesophageal reflux disease without esophagitis   Relevant Medications   famotidine (PEPCID) 20 MG tablet     Endocrine   Type 1 diabetes mellitus (HCC) - Primary    Followed by endocrinology, currently on insulin pump and ozempic      Hypothyroidism    Followed by endocrinology- currently on levothyroxine 75 mcg daily      Relevant Medications   levothyroxine (SYNTHROID) 75 MCG tablet     Musculoskeletal and Integument   Hives    Continue pepcid and hydroxyzine when needed      Relevant Medications   famotidine (PEPCID) 20 MG tablet   hydrOXYzine (ATARAX) 10 MG tablet     Genitourinary   Stage 3a chronic kidney disease (HCC)    Referral placed to nephrology        Other   Anemia    Currently managed by hematology      Sickle  cell trait (HCC)    No concerns      Other Visit Diagnoses     Need for Tdap vaccination       Relevant Orders   Tdap vaccine greater than or equal to 7yo IM (Completed)        Follow up plan: Return for cpe.

## 2023-04-15 ENCOUNTER — Other Ambulatory Visit: Payer: Self-pay

## 2023-04-15 ENCOUNTER — Ambulatory Visit: Payer: BC Managed Care – PPO | Admitting: Nurse Practitioner

## 2023-04-15 ENCOUNTER — Encounter: Payer: Self-pay | Admitting: Nurse Practitioner

## 2023-04-15 VITALS — BP 120/72 | HR 100 | Temp 98.0°F | Resp 16 | Wt 185.0 lb

## 2023-04-15 DIAGNOSIS — Z114 Encounter for screening for human immunodeficiency virus [HIV]: Secondary | ICD-10-CM

## 2023-04-15 DIAGNOSIS — E039 Hypothyroidism, unspecified: Secondary | ICD-10-CM | POA: Diagnosis not present

## 2023-04-15 DIAGNOSIS — N1831 Chronic kidney disease, stage 3a: Secondary | ICD-10-CM

## 2023-04-15 DIAGNOSIS — Z13220 Encounter for screening for lipoid disorders: Secondary | ICD-10-CM

## 2023-04-15 DIAGNOSIS — Z23 Encounter for immunization: Secondary | ICD-10-CM | POA: Diagnosis not present

## 2023-04-15 DIAGNOSIS — K219 Gastro-esophageal reflux disease without esophagitis: Secondary | ICD-10-CM | POA: Diagnosis not present

## 2023-04-15 DIAGNOSIS — E1065 Type 1 diabetes mellitus with hyperglycemia: Secondary | ICD-10-CM

## 2023-04-15 DIAGNOSIS — D508 Other iron deficiency anemias: Secondary | ICD-10-CM

## 2023-04-15 DIAGNOSIS — D573 Sickle-cell trait: Secondary | ICD-10-CM

## 2023-04-15 DIAGNOSIS — L509 Urticaria, unspecified: Secondary | ICD-10-CM | POA: Diagnosis not present

## 2023-04-15 DIAGNOSIS — Z130 Encounter for screening for diseases of the blood and blood-forming organs and certain disorders involving the immune mechanism: Secondary | ICD-10-CM

## 2023-04-15 DIAGNOSIS — Z1159 Encounter for screening for other viral diseases: Secondary | ICD-10-CM

## 2023-04-15 MED ORDER — FAMOTIDINE 20 MG PO TABS: 20.0000 mg | ORAL_TABLET | Freq: Two times a day (BID) | ORAL | 2 refills | Status: DC

## 2023-04-15 MED ORDER — HYDROXYZINE HCL 10 MG PO TABS
ORAL_TABLET | ORAL | 1 refills | Status: DC
Start: 2023-04-15 — End: 2023-08-09

## 2023-04-15 NOTE — Assessment & Plan Note (Signed)
Continue pepcid and hydroxyzine when needed

## 2023-04-15 NOTE — Assessment & Plan Note (Signed)
Currently managed by hematology

## 2023-04-15 NOTE — Assessment & Plan Note (Signed)
Referral placed to nephrology.

## 2023-04-15 NOTE — Assessment & Plan Note (Signed)
Followed by endocrinology- currently on levothyroxine 75 mcg daily

## 2023-04-15 NOTE — Assessment & Plan Note (Signed)
No concerns. 

## 2023-04-15 NOTE — Assessment & Plan Note (Signed)
Followed by endocrinology, currently on insulin pump and ozempic

## 2023-05-04 DIAGNOSIS — E109 Type 1 diabetes mellitus without complications: Secondary | ICD-10-CM | POA: Diagnosis not present

## 2023-05-19 DIAGNOSIS — E039 Hypothyroidism, unspecified: Secondary | ICD-10-CM | POA: Diagnosis not present

## 2023-05-19 DIAGNOSIS — E1065 Type 1 diabetes mellitus with hyperglycemia: Secondary | ICD-10-CM | POA: Diagnosis not present

## 2023-05-19 DIAGNOSIS — Z794 Long term (current) use of insulin: Secondary | ICD-10-CM | POA: Diagnosis not present

## 2023-05-19 DIAGNOSIS — E1165 Type 2 diabetes mellitus with hyperglycemia: Secondary | ICD-10-CM | POA: Diagnosis not present

## 2023-05-19 DIAGNOSIS — Z13220 Encounter for screening for lipoid disorders: Secondary | ICD-10-CM | POA: Diagnosis not present

## 2023-05-19 DIAGNOSIS — E1365 Other specified diabetes mellitus with hyperglycemia: Secondary | ICD-10-CM | POA: Diagnosis not present

## 2023-05-19 DIAGNOSIS — E109 Type 1 diabetes mellitus without complications: Secondary | ICD-10-CM | POA: Diagnosis not present

## 2023-05-19 LAB — HEMOGLOBIN A1C: Hemoglobin A1C: 6.5

## 2023-05-26 ENCOUNTER — Inpatient Hospital Stay: Payer: BC Managed Care – PPO | Attending: Oncology

## 2023-05-26 ENCOUNTER — Inpatient Hospital Stay: Payer: BC Managed Care – PPO

## 2023-05-26 DIAGNOSIS — D508 Other iron deficiency anemias: Secondary | ICD-10-CM

## 2023-05-26 DIAGNOSIS — Z794 Long term (current) use of insulin: Secondary | ICD-10-CM | POA: Diagnosis not present

## 2023-05-26 DIAGNOSIS — E119 Type 2 diabetes mellitus without complications: Secondary | ICD-10-CM | POA: Insufficient documentation

## 2023-05-26 DIAGNOSIS — Z975 Presence of (intrauterine) contraceptive device: Secondary | ICD-10-CM | POA: Diagnosis not present

## 2023-05-26 DIAGNOSIS — D509 Iron deficiency anemia, unspecified: Secondary | ICD-10-CM | POA: Insufficient documentation

## 2023-05-26 DIAGNOSIS — D573 Sickle-cell trait: Secondary | ICD-10-CM | POA: Diagnosis not present

## 2023-05-26 DIAGNOSIS — Z803 Family history of malignant neoplasm of breast: Secondary | ICD-10-CM | POA: Diagnosis not present

## 2023-05-26 LAB — CBC WITH DIFFERENTIAL/PLATELET
Abs Immature Granulocytes: 0.04 10*3/uL (ref 0.00–0.07)
Basophils Absolute: 0 10*3/uL (ref 0.0–0.1)
Basophils Relative: 1 %
Eosinophils Absolute: 0.2 10*3/uL (ref 0.0–0.5)
Eosinophils Relative: 3 %
HCT: 37 % (ref 36.0–46.0)
Hemoglobin: 12.6 g/dL (ref 12.0–15.0)
Immature Granulocytes: 1 %
Lymphocytes Relative: 35 %
Lymphs Abs: 2.3 10*3/uL (ref 0.7–4.0)
MCH: 28.6 pg (ref 26.0–34.0)
MCHC: 34.1 g/dL (ref 30.0–36.0)
MCV: 84.1 fL (ref 80.0–100.0)
Monocytes Absolute: 0.6 10*3/uL (ref 0.1–1.0)
Monocytes Relative: 9 %
Neutro Abs: 3.4 10*3/uL (ref 1.7–7.7)
Neutrophils Relative %: 51 %
Platelets: 282 10*3/uL (ref 150–400)
RBC: 4.4 MIL/uL (ref 3.87–5.11)
RDW: 12.4 % (ref 11.5–15.5)
WBC: 6.6 10*3/uL (ref 4.0–10.5)
nRBC: 0 % (ref 0.0–0.2)

## 2023-05-26 LAB — IRON AND TIBC
Iron: 66 ug/dL (ref 28–170)
Saturation Ratios: 21 % (ref 10.4–31.8)
TIBC: 315 ug/dL (ref 250–450)
UIBC: 249 ug/dL

## 2023-05-26 LAB — FERRITIN: Ferritin: 58 ng/mL (ref 11–307)

## 2023-05-26 MED FILL — Iron Sucrose Inj 20 MG/ML (Fe Equiv): INTRAVENOUS | Qty: 10 | Status: AC

## 2023-05-27 ENCOUNTER — Inpatient Hospital Stay: Payer: BC Managed Care – PPO

## 2023-05-27 ENCOUNTER — Inpatient Hospital Stay: Payer: BC Managed Care – PPO | Admitting: Oncology

## 2023-05-27 ENCOUNTER — Encounter: Payer: Self-pay | Admitting: Oncology

## 2023-05-27 VITALS — BP 113/80 | HR 72 | Temp 97.5°F | Resp 18 | Wt 187.5 lb

## 2023-05-27 DIAGNOSIS — D573 Sickle-cell trait: Secondary | ICD-10-CM | POA: Diagnosis not present

## 2023-05-27 DIAGNOSIS — Z975 Presence of (intrauterine) contraceptive device: Secondary | ICD-10-CM | POA: Diagnosis not present

## 2023-05-27 DIAGNOSIS — D509 Iron deficiency anemia, unspecified: Secondary | ICD-10-CM | POA: Diagnosis not present

## 2023-05-27 DIAGNOSIS — Z803 Family history of malignant neoplasm of breast: Secondary | ICD-10-CM | POA: Diagnosis not present

## 2023-05-27 DIAGNOSIS — Z794 Long term (current) use of insulin: Secondary | ICD-10-CM | POA: Diagnosis not present

## 2023-05-27 DIAGNOSIS — E119 Type 2 diabetes mellitus without complications: Secondary | ICD-10-CM | POA: Diagnosis not present

## 2023-05-27 NOTE — Assessment & Plan Note (Signed)
no need for intervention.  Hemoglobin is stable and normal. Discussed about prenatal genetic counseling if she is planning for family

## 2023-05-27 NOTE — Assessment & Plan Note (Addendum)
Labs are reviewed and discussed with patient. Lab Results  Component Value Date   HGB 12.6 05/26/2023   TIBC 315 05/26/2023   IRONPCTSAT 21 05/26/2023   FERRITIN 58 05/26/2023    No need for iron supplementation.

## 2023-05-27 NOTE — Progress Notes (Signed)
Hematology/Oncology Progress note [phone] [phone]   CHIEF COMPLAINTS/REASON FOR VISIT:  iron deficiency anemia  ASSESSMENT & PLAN:   Iron deficiency anemia Labs are reviewed and discussed with patient. Lab Results  Component Value Date   HGB 12.6 05/26/2023   TIBC 315 05/26/2023   IRONPCTSAT 21 05/26/2023   FERRITIN 58 05/26/2023    No need for iron supplementation.   Sickle cell trait (HCC) no need for intervention.  Hemoglobin is stable and normal. Discussed about prenatal genetic counseling if she is planning for family  No orders of the defined types were placed in this encounter.  Patient is discharged from my clinic. I recommend patient to continue follow up with primary care physician. Patient may re-establish care in the future if clinically indicated.  All questions were answered. The patient knows to call the clinic with any problems, questions or concerns.  Rickard Patience, MD, PhD [facility] Hematology Oncology 05/27/2023    HISTORY OF PRESENTING ILLNESS:   Patricia Ramirez is a  27 y.o.  female follows up for iron deficiency anemia Patient reports history of iron deficiency anemia as a teenager. She was told that was due to heavy menstrual bleed.  She has had IUD placed in 2017 and has not had period.  No bloody or tarry stool, no epigastric discomfort.  She has type 1 DM and per patient , she may have diarrhea when blood sugar is not controlled.  She was told by her parents that she has sickle cell trait.   INTERVAL HISTORY Patricia Ramirez is a 27 y.o. female who has above history reviewed by me today presents for follow up visit for iron deficiency anemia Patient has previously tolerated IV Venofer treatments. Seizure-like activity, no recurrent episodes.  She has followed up with neurology. She denies any black stool or bloody stool.  No dietary restrictions. She has no menstrual period due to Mirena IUD.  Review of Systems   Constitutional:  Negative for appetite change, chills, fatigue and fever.  HENT:   Negative for hearing loss and voice change.   Eyes:  Negative for eye problems.  Respiratory:  Negative for chest tightness and cough.   Cardiovascular:  Negative for chest pain.  Gastrointestinal:  Negative for abdominal distention, abdominal pain and blood in stool.  Endocrine: Negative for hot flashes.  Genitourinary:  Negative for difficulty urinating and frequency.   Musculoskeletal:  Negative for arthralgias.  Skin:  Negative for itching and rash.  Neurological:  Negative for extremity weakness.  Hematological:  Negative for adenopathy.  Psychiatric/Behavioral:  Negative for confusion.     MEDICAL HISTORY:  Past Medical History:  Diagnosis Date   Anemia    Graves' disease in remission    Type 1 diabetes (HCC)     SURGICAL HISTORY: Past Surgical History:  Procedure Laterality Date   COLONOSCOPY  2004   WISDOM TOOTH EXTRACTION  2019    SOCIAL HISTORY: Social History   Socioeconomic History   Marital status: Married    Spouse name: Not on file   Number of children: Not on file   Years of education: Not on file   Highest education level: Not on file  Occupational History   Not on file  Tobacco Use   Smoking status: Never   Smokeless tobacco: Never  Vaping Use   Vaping status: Never Used  Substance and Sexual Activity   Alcohol use: Yes    Comment: occasionaly   Drug use: Never   Sexual activity:  Yes    Birth control/protection: I.U.D.  Other Topics Concern   Not on file  Social History Narrative   ** Merged History Encounter **       Social Determinants of Health   Financial Resource Strain: Not on file  Food Insecurity: Not on file  Transportation Needs: Not on file  Physical Activity: Not on file  Stress: Not on file  Social Connections: Not on file  Intimate Partner Violence: Not on file    FAMILY HISTORY: Family History  Problem Relation Age of Onset    Hypertension Father    Breast cancer Maternal Grandmother    Glaucoma Maternal Grandfather    Prostate cancer Maternal Grandfather    Hypertension Paternal Grandfather     ALLERGIES:  is allergic to tuberculin tests, aleve [naproxen], other, and tuberculin ppd.  MEDICATIONS:  Current Outpatient Medications  Medication Sig Dispense Refill   Cholecalciferol 50 MCG (2000 UT) TABS Take by mouth.     famotidine (PEPCID) 20 MG tablet Take 1 tablet (20 mg total) by mouth 2 (two) times daily. 180 tablet 2   gabapentin (NEURONTIN) 100 MG capsule Take 100 mg by mouth. Taking 200 mg in AM 200 mg at lunch 300 mg at night     glucagon 1 MG injection      hydrOXYzine (ATARAX) 10 MG tablet TAKE 1 TABLET BY MOUTH EVERYDAY AT BEDTIME 90 tablet 1   insulin aspart (NOVOLOG) 100 UNIT/ML injection INJECT SUBCUTANEOUSLY VIA INSULIN PUMP UP TO 90 UNITS/DAY     Insulin Disposable Pump (OMNIPOD 5 G6 INTRO, GEN 5,) KIT Inject into the skin.     levothyroxine (SYNTHROID) 75 MCG tablet Take 75 mcg by mouth daily before breakfast.     ondansetron (ZOFRAN) 8 MG tablet Take 1 tablet (8 mg total) by mouth every 8 (eight) hours as needed for nausea or vomiting. 20 tablet 0   OZEMPIC, 0.25 OR 0.5 MG/DOSE, 2 MG/3ML SOPN Inject into the skin.     SUMAtriptan (IMITREX) 50 MG tablet SMARTSIG:1 Tablet(s) By Mouth     valACYclovir (VALTREX) 500 MG tablet TAKE 1 TABLET BY MOUTH DAILY. CAN INCREASE TO TWICE A DAY FOR 5 DAYS IN THE EVENT OF A RECURRENCE 90 tablet 3   Current Facility-Administered Medications  Medication Dose Route Frequency Provider Last Rate Last Admin   albuterol (PROVENTIL) (2.5 MG/3ML) 0.083% nebulizer solution 2.5 mg  2.5 mg Nebulization Once Viviano Simas, FNP         PHYSICAL EXAMINATION: ECOG PERFORMANCE STATUS: 0 - Asymptomatic Vitals:   05/27/23 1405  BP: 113/80  Pulse: 72  Resp: 18  Temp: (!) 97.5 F (36.4 C)   Filed Weights   05/27/23 1405  Weight: 187 lb 8 oz (85 kg)    Physical  Exam Constitutional:      General: She is not in acute distress. HENT:     Head: Normocephalic and atraumatic.  Eyes:     General: No scleral icterus. Cardiovascular:     Rate and Rhythm: Normal rate.  Pulmonary:     Effort: Pulmonary effort is normal. No respiratory distress.  Abdominal:     General: There is no distension.  Musculoskeletal:        General: Normal range of motion.     Cervical back: Normal range of motion and neck supple.  Skin:    Findings: No erythema or rash.  Neurological:     Mental Status: She is alert. Mental status is at baseline.  Psychiatric:  Mood and Affect: Mood normal.     LABORATORY DATA:  I have reviewed the data as listed    Latest Ref Rng & Units 05/26/2023    8:03 AM 01/05/2023    2:33 PM 05/25/2022    2:50 PM  CBC  WBC 4.0 - 10.5 K/uL 6.6  10.2  6.5   Hemoglobin 12.0 - 15.0 g/dL 24.4  01.0  27.2   Hematocrit 36.0 - 46.0 % 37.0  40.2  37.5   Platelets 150 - 400 K/uL 282  284  287        Latest Ref Rng & Units 01/05/2023    2:33 PM 12/25/2022    7:40 AM 12/30/2021    8:02 AM  CMP  Glucose 70 - 99 mg/dL 95  536  644   BUN 6 - 20 mg/dL 14  17  17    Creatinine 0.57 - 1.00 mg/dL 0.34  7.42  5.95   Sodium 134 - 144 mmol/L 142  137  139   Potassium 3.5 - 5.2 mmol/L 4.8  4.4  4.7   Chloride 96 - 106 mmol/L 106  102  105   CO2 20 - 29 mmol/L 14  22  23    Calcium 8.7 - 10.2 mg/dL 9.9  63.8  9.1   Total Protein 6.0 - 8.5 g/dL 7.9     Total Bilirubin 0.0 - 1.2 mg/dL 0.3     Alkaline Phos 44 - 121 IU/L 66     AST 0 - 40 IU/L 31     ALT 0 - 32 IU/L 28       Iron/TIBC/Ferritin/ %Sat    Component Value Date/Time   IRON 66 05/26/2023 0803   IRON 46 04/23/2022 1345   TIBC 315 05/26/2023 0803   TIBC 293 04/23/2022 1345   FERRITIN 58 05/26/2023 0803   FERRITIN 98 04/23/2022 1345   IRONPCTSAT 21 05/26/2023 0803   IRONPCTSAT 16 04/23/2022 1345      RADIOGRAPHIC STUDIES: I have personally reviewed the radiological images as listed  and agreed with the findings in the report. No results found.

## 2023-06-04 ENCOUNTER — Ambulatory Visit (INDEPENDENT_AMBULATORY_CARE_PROVIDER_SITE_OTHER): Payer: Self-pay | Admitting: Adult Health

## 2023-06-04 ENCOUNTER — Encounter: Payer: Self-pay | Admitting: Adult Health

## 2023-06-04 VITALS — BP 120/86 | HR 90 | Temp 98.0°F | Ht 64.5 in | Wt 189.0 lb

## 2023-06-04 DIAGNOSIS — B3731 Acute candidiasis of vulva and vagina: Secondary | ICD-10-CM

## 2023-06-04 DIAGNOSIS — J0140 Acute pansinusitis, unspecified: Secondary | ICD-10-CM

## 2023-06-04 MED ORDER — FLUCONAZOLE 150 MG PO TABS: ORAL_TABLET | ORAL | 0 refills | Status: DC

## 2023-06-04 MED ORDER — AMOXICILLIN-POT CLAVULANATE 875-125 MG PO TABS
1.0000 | ORAL_TABLET | Freq: Two times a day (BID) | ORAL | 0 refills | Status: DC
Start: 2023-06-04 — End: 2023-08-18

## 2023-06-04 NOTE — Progress Notes (Signed)
[facility] [address] [place], [place] [address]   Office Visit Note  Patient Name: Patricia Ramirez Date of Birth [id number]  Medical Record number [id number]  Date of Service: 06/04/2023  Chief Complaint  Patient presents with   Acute Visit    Patient c/o sinus pressure, headache, and green nasal drainage for approximately one month. She is concerned that she may have a sinus infection. She also reports an aching in the left side of her chest above her breast which radiates to her back. She does not feel Imitrex has been helpful in alleviating migraine headaches.     HPI Pt is here for a sick visit. She believes she may have a sinus infection.  She reports a month of Green drainage, congestion, sinus pressure, headaches.  Denies fever, chills, ear pain or cough. Her blood sugars have been mildly elevated 40-50 points above her normal.    Current Medication:  Outpatient Encounter Medications as of 06/04/2023  Medication Sig   amoxicillin-clavulanate (AUGMENTIN) 875-125 MG tablet Take 1 tablet by mouth 2 (two) times daily.   Cholecalciferol 50 MCG (2000 UT) TABS Take by mouth daily.   famotidine (PEPCID) 20 MG tablet Take 1 tablet (20 mg total) by mouth 2 (two) times daily.   fluconazole (DIFLUCAN) 150 MG tablet Take one tablet by mouth at onset of symptoms. Then repeat every 5 days as needed for continuing symptoms.   gabapentin (NEURONTIN) 100 MG capsule Take 100 mg by mouth. Taking 200 mg in AM 200 mg at lunch 300 mg at night   glucagon 1 MG injection as directed.   hydrOXYzine (ATARAX) 10 MG tablet TAKE 1 TABLET BY MOUTH EVERYDAY AT BEDTIME   insulin aspart (NOVOLOG) 100 UNIT/ML injection INJECT SUBCUTANEOUSLY VIA INSULIN PUMP UP TO 90 UNITS/DAY   Insulin Disposable Pump (OMNIPOD 5 G6 INTRO, GEN 5,) KIT Inject into the skin.   levothyroxine (SYNTHROID) 88 MCG tablet Take 88 mcg by mouth daily before breakfast.   ondansetron (ZOFRAN) 8 MG tablet Take 1 tablet (8 mg total) by  mouth every 8 (eight) hours as needed for nausea or vomiting.   OZEMPIC, 0.25 OR 0.5 MG/DOSE, 2 MG/3ML SOPN Inject 1 mg into the skin once a week.   SUMAtriptan (IMITREX) 50 MG tablet as needed.   valACYclovir (VALTREX) 500 MG tablet TAKE 1 TABLET BY MOUTH DAILY. CAN INCREASE TO TWICE A DAY FOR 5 DAYS IN THE EVENT OF A RECURRENCE   [DISCONTINUED] levothyroxine (SYNTHROID) 75 MCG tablet Take 75 mcg by mouth daily before breakfast.   Facility-Administered Encounter Medications as of 06/04/2023  Medication   albuterol (PROVENTIL) (2.5 MG/3ML) 0.083% nebulizer solution 2.5 mg      Medical History: Past Medical History:  Diagnosis Date   Anemia    Graves' disease in remission    Type 1 diabetes (HCC)      Vital Signs: BP 120/86 (BP Location: Left Arm, Patient Position: Sitting, Cuff Size: Normal)   Pulse 90   Temp 98 F (36.7 C)   Ht 5' 4.5" (1.638 m)   Wt 189 lb (85.7 kg)   SpO2 99%   BMI 31.94 kg/m    Review of Systems  Constitutional:  Negative for chills, fatigue and fever.  HENT:  Positive for congestion and sinus pain.   Eyes:  Negative for pain and itching.    Physical Exam Vitals and nursing note reviewed.  Constitutional:      General: She is not in acute distress.  Appearance: She is normal weight. She is not ill-appearing.  HENT:     Head: Normocephalic.     Right Ear: Tympanic membrane normal.     Left Ear: Tympanic membrane normal.     Nose:     Right Sinus: Maxillary sinus tenderness and frontal sinus tenderness present.     Left Sinus: Maxillary sinus tenderness and frontal sinus tenderness present.     Mouth/Throat:     Mouth: Mucous membranes are dry.  Eyes:     General:        Right eye: No discharge.        Left eye: No discharge.     Pupils: Pupils are equal, round, and reactive to light.  Neck:     Vascular: No carotid bruit.  Cardiovascular:     Rate and Rhythm: Normal rate and regular rhythm.     Pulses: Normal pulses.     Heart  sounds: No murmur heard.    No friction rub.  Pulmonary:     Effort: Pulmonary effort is normal. No respiratory distress.     Breath sounds: Normal breath sounds. No wheezing.  Abdominal:     General: Abdomen is flat. Bowel sounds are normal.     Palpations: Abdomen is soft. There is no mass.     Tenderness: There is no abdominal tenderness. There is no right CVA tenderness or left CVA tenderness.     Hernia: No hernia is present.  Musculoskeletal:        General: Normal range of motion.     Cervical back: Normal range of motion. No rigidity.  Lymphadenopathy:     Cervical: No cervical adenopathy.  Skin:    General: Skin is warm and dry.  Neurological:     General: No focal deficit present.     Mental Status: She is alert and oriented to person, place, and time.  Psychiatric:        Mood and Affect: Mood normal.        Behavior: Behavior normal.    Assessment/Plan: 1. Acute non-recurrent pansinusitis Patient Instructions: -Take complete course of antibiotics as prescribed.  Take with food.  -Try Flonase/Fluticasone nasal spray, 2 sprays to each nostril once a day. -You can try using a neti pot or nasal saline rinse product to help clear mucus congestion. -Rest and stay well hydrated (by drinking water and other liquids). Avoid/limit caffeine. -Take over-the-counter medicines (i.e. Mucinex, decongestant, Ibuprofen or Tylenol, cough suppressant) to help relieve your symptoms. -For your cough, use cough drops/throat lozenges, gargle warm salt water and/or drink warm liquids (like tea with honey). -Send my chart message to provider or schedule return visit as needed for new/worsening symptoms or if symptoms do not improve as discussed with antibiotic and other recommended treatment.   - amoxicillin-clavulanate (AUGMENTIN) 875-125 MG tablet; Take 1 tablet by mouth 2 (two) times daily.  Dispense: 20 tablet; Refill: 0  2. Vaginal yeast infection Take Fluconazole as prescribed for  yeast.  - fluconazole (DIFLUCAN) 150 MG tablet; Take one tablet by mouth at onset of symptoms. Then repeat every 5 days as needed for continuing symptoms.  Dispense: 3 tablet; Refill: 0     General Counseling: Byrdie verbalizes understanding of the findings of todays visit and agrees with plan of treatment. I have discussed any further diagnostic evaluation that may be needed or ordered today. We also reviewed her medications today. she has been encouraged to call the office with any questions or concerns that should  arise related to todays visit.   No orders of the defined types were placed in this encounter.   Meds ordered this encounter  Medications   amoxicillin-clavulanate (AUGMENTIN) 875-125 MG tablet    Sig: Take 1 tablet by mouth 2 (two) times daily.    Dispense:  20 tablet    Refill:  0   fluconazole (DIFLUCAN) 150 MG tablet    Sig: Take one tablet by mouth at onset of symptoms. Then repeat every 5 days as needed for continuing symptoms.    Dispense:  3 tablet    Refill:  0    Time spent:15 Minutes    [name] AGNP-C Nurse Practitioner

## 2023-06-09 DIAGNOSIS — N182 Chronic kidney disease, stage 2 (mild): Secondary | ICD-10-CM | POA: Diagnosis not present

## 2023-06-09 DIAGNOSIS — R8290 Unspecified abnormal findings in urine: Secondary | ICD-10-CM | POA: Diagnosis not present

## 2023-06-09 DIAGNOSIS — E1022 Type 1 diabetes mellitus with diabetic chronic kidney disease: Secondary | ICD-10-CM | POA: Diagnosis not present

## 2023-06-10 ENCOUNTER — Other Ambulatory Visit: Payer: Self-pay | Admitting: Nephrology

## 2023-06-10 DIAGNOSIS — R8290 Unspecified abnormal findings in urine: Secondary | ICD-10-CM

## 2023-06-10 DIAGNOSIS — E1022 Type 1 diabetes mellitus with diabetic chronic kidney disease: Secondary | ICD-10-CM

## 2023-06-10 DIAGNOSIS — N182 Chronic kidney disease, stage 2 (mild): Secondary | ICD-10-CM

## 2023-06-17 ENCOUNTER — Ambulatory Visit (INDEPENDENT_AMBULATORY_CARE_PROVIDER_SITE_OTHER): Payer: Self-pay | Admitting: Adult Health

## 2023-06-17 DIAGNOSIS — L509 Urticaria, unspecified: Secondary | ICD-10-CM

## 2023-06-17 NOTE — Progress Notes (Signed)
Virtual Visit Consent   Patricia Ramirez, you are scheduled for a virtual visit with a [facility] provider today. Just as with appointments in the office, your consent must be obtained to participate.   I need to obtain your verbal consent now. Are you willing to proceed with your visit today? Patricia Ramirez has provided verbal consent on 06/17/2023 for a virtual visit (video or telephone). Patricia Acosta, NP  Date: 06/17/2023 2:38 PM  Virtual Visit via Video Note   I, Patricia Ramirez, connected with  Patricia Ramirez  ([id number], 06-15-1996) on 06/17/23 at  2:30 PM EDT by telephone and verified that I am speaking with the correct person using two identifiers.  Location: Patient: Virtual Visit Location Patient: Home Provider: Virtual Visit Location Provider: Office/Clinic   I discussed the limitations of evaluation and management by telemedicine and the availability of in person appointments. The patient expressed understanding and agreed to proceed.    History of Present Illness: Patricia Ramirez is a 27 y.o. and is being seen today for Rash, hives on face.  She noticed it the morning after taking Diflucan  HPI: HPI  Problems:  Patient Active Problem List   Diagnosis Date Noted   Hypothyroidism 04/15/2023   Hives 04/15/2023   Stage 3a chronic kidney disease (HCC) 04/15/2023   Sickle cell trait (HCC) 11/25/2021   RBC microcytosis 05/16/2021   Iron deficiency anemia 05/16/2021   IUD (intrauterine device) in place 08/28/2019   Insulin pump status 01/13/2019   Herpes simplex infection 10/29/2017   Anemia 06/05/2014   Type 1 diabetes mellitus (HCC) 06/05/2014    Allergies:  Allergies  Allergen Reactions   Tuberculin Tests Rash    2013/2014   Aleve [Naproxen] Itching   Other Rash    TB SKIN TEST   Tuberculin Ppd Itching   Medications:  Current Outpatient Medications:    amoxicillin-clavulanate (AUGMENTIN) 875-125 MG tablet, Take 1 tablet by mouth 2 (two) times daily., Disp: 20 tablet,  Rfl: 0   Cholecalciferol 50 MCG (2000 UT) TABS, Take by mouth daily., Disp: , Rfl:    famotidine (PEPCID) 20 MG tablet, Take 1 tablet (20 mg total) by mouth 2 (two) times daily., Disp: 180 tablet, Rfl: 2   fluconazole (DIFLUCAN) 150 MG tablet, Take one tablet by mouth at onset of symptoms. Then repeat every 5 days as needed for continuing symptoms., Disp: 3 tablet, Rfl: 0   gabapentin (NEURONTIN) 100 MG capsule, Take 100 mg by mouth. Taking 200 mg in AM 200 mg at lunch 300 mg at night, Disp: , Rfl:    glucagon 1 MG injection, as directed., Disp: , Rfl:    hydrOXYzine (ATARAX) 10 MG tablet, TAKE 1 TABLET BY MOUTH EVERYDAY AT BEDTIME, Disp: 90 tablet, Rfl: 1   insulin aspart (NOVOLOG) 100 UNIT/ML injection, INJECT SUBCUTANEOUSLY VIA INSULIN PUMP UP TO 90 UNITS/DAY, Disp: , Rfl:    Insulin Disposable Pump (OMNIPOD 5 G6 INTRO, GEN 5,) KIT, Inject into the skin., Disp: , Rfl:    levothyroxine (SYNTHROID) 88 MCG tablet, Take 88 mcg by mouth daily before breakfast., Disp: , Rfl:    ondansetron (ZOFRAN) 8 MG tablet, Take 1 tablet (8 mg total) by mouth every 8 (eight) hours as needed for nausea or vomiting., Disp: 20 tablet, Rfl: 0   OZEMPIC, 0.25 OR 0.5 MG/DOSE, 2 MG/3ML SOPN, Inject 1 mg into the skin once a week., Disp: , Rfl:    SUMAtriptan (IMITREX) 50 MG tablet, as needed., Disp: , Rfl:  valACYclovir (VALTREX) 500 MG tablet, TAKE 1 TABLET BY MOUTH DAILY. CAN INCREASE TO TWICE A DAY FOR 5 DAYS IN THE EVENT OF A RECURRENCE, Disp: 90 tablet, Rfl: 3  Current Facility-Administered Medications:    albuterol (PROVENTIL) (2.5 MG/3ML) 0.083% nebulizer solution 2.5 mg, 2.5 mg, Nebulization, Once, Viviano Simas, FNP  Observations/Objective: No labored breathing. Speaking in complete sentences. Speech is clear and coherent with logical content.  Patient is alert and oriented at baseline.    Assessment and Plan: 1. Hives  1. Hives Recommend not taking Diflucan anymore.  Take Allegra for the next few  days.  Would consider benadryl at bedtime as alternative.  Follow up via MyChart messenger if symptoms fail to improve or may return to clinic as needed for worsening symptoms.       Follow Up Instructions: I discussed the assessment and treatment plan with the patient. The patient was provided an opportunity to ask questions and all were answered. The patient agreed with the plan and demonstrated an understanding of the instructions.    The patient was advised to call back or seek an in-person evaluation if the symptoms worsen or if the condition fails to improve as anticipated.  Time:  I spent 15 minutes with the patient via telehealth technology discussing the above problems/concerns.    Patricia Acosta, NP

## 2023-07-27 DIAGNOSIS — Z23 Encounter for immunization: Secondary | ICD-10-CM | POA: Diagnosis not present

## 2023-08-07 ENCOUNTER — Other Ambulatory Visit: Payer: Self-pay | Admitting: Nurse Practitioner

## 2023-08-07 DIAGNOSIS — L509 Urticaria, unspecified: Secondary | ICD-10-CM

## 2023-08-09 NOTE — Telephone Encounter (Signed)
Requested Prescriptions  Pending Prescriptions Disp Refills   hydrOXYzine (ATARAX) 10 MG tablet [Pharmacy Med Name: HYDROXYZINE HCL 10 MG TABLET] 90 tablet 0    Sig: TAKE 1 TABLET BY MOUTH EVERYDAY AT BEDTIME     Ear, Nose, and Throat:  Antihistamines 2 Failed - 08/07/2023  2:33 PM      Failed - Cr in normal range and within 360 days    Creatinine, Ser  Date Value Ref Range Status  01/05/2023 1.29 (H) 0.57 - 1.00 mg/dL Final         Passed - Valid encounter within last 12 months    Recent Outpatient Visits           3 months ago Type 1 diabetes mellitus with hyperglycemia [facility])   [facility] Berniece Salines, FNP       Future Appointments             In 2 months Zane Herald, Rudolpho Sevin, FNP [facility], [facility]

## 2023-08-10 DIAGNOSIS — Z8669 Personal history of other diseases of the nervous system and sense organs: Secondary | ICD-10-CM | POA: Diagnosis not present

## 2023-08-10 DIAGNOSIS — R569 Unspecified convulsions: Secondary | ICD-10-CM | POA: Insufficient documentation

## 2023-08-10 DIAGNOSIS — R200 Anesthesia of skin: Secondary | ICD-10-CM | POA: Diagnosis not present

## 2023-08-10 DIAGNOSIS — R55 Syncope and collapse: Secondary | ICD-10-CM | POA: Diagnosis not present

## 2023-08-10 DIAGNOSIS — D573 Sickle-cell trait: Secondary | ICD-10-CM | POA: Diagnosis not present

## 2023-08-10 DIAGNOSIS — G5601 Carpal tunnel syndrome, right upper limb: Secondary | ICD-10-CM | POA: Insufficient documentation

## 2023-08-18 ENCOUNTER — Other Ambulatory Visit: Payer: Self-pay

## 2023-08-18 ENCOUNTER — Encounter: Payer: Self-pay | Admitting: Physician Assistant

## 2023-08-18 ENCOUNTER — Ambulatory Visit: Payer: BC Managed Care – PPO | Attending: Student

## 2023-08-18 ENCOUNTER — Ambulatory Visit (INDEPENDENT_AMBULATORY_CARE_PROVIDER_SITE_OTHER): Payer: Self-pay | Admitting: Physician Assistant

## 2023-08-18 VITALS — BP 120/90 | HR 89 | Temp 98.2°F | Ht 64.5 in | Wt 185.0 lb

## 2023-08-18 DIAGNOSIS — R051 Acute cough: Secondary | ICD-10-CM

## 2023-08-18 DIAGNOSIS — M6281 Muscle weakness (generalized): Secondary | ICD-10-CM | POA: Diagnosis not present

## 2023-08-18 DIAGNOSIS — R202 Paresthesia of skin: Secondary | ICD-10-CM | POA: Diagnosis not present

## 2023-08-18 DIAGNOSIS — G5601 Carpal tunnel syndrome, right upper limb: Secondary | ICD-10-CM | POA: Diagnosis not present

## 2023-08-18 DIAGNOSIS — J069 Acute upper respiratory infection, unspecified: Secondary | ICD-10-CM

## 2023-08-18 MED ORDER — ALBUTEROL SULFATE HFA 108 (90 BASE) MCG/ACT IN AERS
2.0000 | INHALATION_SPRAY | Freq: Four times a day (QID) | RESPIRATORY_TRACT | 1 refills | Status: DC | PRN
Start: 2023-08-18 — End: 2023-10-15

## 2023-08-18 MED ORDER — BENZONATATE 200 MG PO CAPS
200.0000 mg | ORAL_CAPSULE | Freq: Three times a day (TID) | ORAL | 0 refills | Status: DC | PRN
Start: 2023-08-18 — End: 2023-10-15

## 2023-08-18 NOTE — Progress Notes (Signed)
[facility] [address] [place], [place] [address]   Office Visit Note  Patient Name: Patricia Ramirez Date of Birth [id number]  Medical Record number [id number]  Date of Service: 08/18/2023  Chief Complaint  Patient presents with   Sick visit    Patient c/o dry cough and sore throat that have been persistent since 08/07/23. She has been taking Delsym, Dayquil, and Nyquil which have provided short-term relief. No known sick contacts. She tested negative twice for COVID at onset of symptoms.     [name] presents to the clinic for c/o cough, post nasal drainage, and slight sore throat x 9-10 days. Currently denies fever, chills, CP, SOB, or pain with swallowing. Tested negative twice for Covid. Has been taking Dayquil, Nyquil, Delsym without much relief.       Current Medication:  Outpatient Encounter Medications as of 08/18/2023  Medication Sig   albuterol (VENTOLIN HFA) 108 (90 Base) MCG/ACT inhaler Inhale 2 puffs into the lungs every 6 (six) hours as needed for wheezing or shortness of breath.   benzonatate (TESSALON) 200 MG capsule Take 1 capsule (200 mg total) by mouth 3 (three) times daily as needed for cough.   Cholecalciferol 50 MCG (2000 UT) TABS Take by mouth daily.   famotidine (PEPCID) 20 MG tablet Take 1 tablet (20 mg total) by mouth 2 (two) times daily.   fluconazole (DIFLUCAN) 150 MG tablet Take one tablet by mouth at onset of symptoms. Then repeat every 5 days as needed for continuing symptoms.   gabapentin (NEURONTIN) 100 MG capsule Take 100 mg by mouth. Taking 200 mg in AM 200 mg at lunch 300 mg at night   glucagon 1 MG injection as directed.   hydrOXYzine (ATARAX) 10 MG tablet TAKE 1 TABLET BY MOUTH EVERYDAY AT BEDTIME   insulin aspart (NOVOLOG) 100 UNIT/ML injection INJECT SUBCUTANEOUSLY VIA INSULIN PUMP UP TO 90 UNITS/DAY   Insulin Disposable Pump (OMNIPOD 5 G6 INTRO, GEN 5,) KIT Inject into the skin.   levothyroxine (SYNTHROID) 88 MCG tablet Take 88 mcg  by mouth daily before breakfast.   ondansetron (ZOFRAN) 8 MG tablet Take 1 tablet (8 mg total) by mouth every 8 (eight) hours as needed for nausea or vomiting.   OZEMPIC, 0.25 OR 0.5 MG/DOSE, 2 MG/3ML SOPN Inject 1 mg into the skin once a week.   SUMAtriptan (IMITREX) 50 MG tablet as needed.   valACYclovir (VALTREX) 500 MG tablet TAKE 1 TABLET BY MOUTH DAILY. CAN INCREASE TO TWICE A DAY FOR 5 DAYS IN THE EVENT OF A RECURRENCE   [DISCONTINUED] amoxicillin-clavulanate (AUGMENTIN) 875-125 MG tablet Take 1 tablet by mouth 2 (two) times daily. (Patient not taking: Reported on 08/18/2023)   [DISCONTINUED] albuterol (PROVENTIL) (2.5 MG/3ML) 0.083% nebulizer solution 2.5 mg    No facility-administered encounter medications on file as of 08/18/2023.      Medical History: Past Medical History:  Diagnosis Date   Anemia    Graves' disease in remission    Type 1 diabetes (HCC)      Vital Signs: BP (!) 120/90   Pulse 89   Temp 98.2 F (36.8 C)   Ht 5' 4.5" (1.638 m)   Wt 185 lb (83.9 kg)   SpO2 99%   BMI 31.26 kg/m    Review of Systems  Constitutional: Negative.   HENT:  Positive for congestion, postnasal drip and sore throat. Negative for sinus pressure, sinus pain and trouble swallowing.   Respiratory:  Positive for cough and  wheezing (mild). Negative for chest tightness.   Cardiovascular: Negative.   Neurological: Negative.     Physical Exam Constitutional:      Appearance: Normal appearance.  HENT:     Head: Atraumatic.     Right Ear: Tympanic membrane, ear canal and external ear normal.     Left Ear: Tympanic membrane, ear canal and external ear normal.     Nose: Nose normal.     Right Turbinates: Enlarged and pale.     Left Turbinates: Enlarged and pale.     Right Sinus: No maxillary sinus tenderness or frontal sinus tenderness.     Left Sinus: No maxillary sinus tenderness or frontal sinus tenderness.     Mouth/Throat:     Mouth: Mucous membranes are moist.      Pharynx: Oropharynx is clear.  Eyes:     Extraocular Movements: Extraocular movements intact.  Cardiovascular:     Rate and Rhythm: Normal rate and regular rhythm.  Pulmonary:     Effort: Pulmonary effort is normal. No respiratory distress.     Breath sounds: Normal breath sounds. No wheezing, rhonchi or rales.  Musculoskeletal:     Cervical back: Neck supple.  Skin:    General: Skin is warm.  Neurological:     Mental Status: She is alert.  Psychiatric:        Mood and Affect: Mood normal.        Behavior: Behavior normal.        Thought Content: Thought content normal.        Judgment: Judgment normal.       Assessment/Plan:  1. Viral upper respiratory tract infection  2. Acute cough - albuterol (VENTOLIN HFA) 108 (90 Base) MCG/ACT inhaler; Inhale 2 puffs into the lungs every 6 (six) hours as needed for wheezing or shortness of breath.  Dispense: 8 g; Refill: 1 - benzonatate (TESSALON) 200 MG capsule; Take 1 capsule (200 mg total) by mouth 3 (three) times daily as needed for cough.  Dispense: 30 capsule; Refill: 0  Increase fluids Take medicines as prescribed Use Flonase nasal spray May consider oral decongestant ie Sudafed as directed on the box. Continue to watch for worsening symptoms Pt verbalized understanding and in agreement.     General Counseling: [name] verbalizes understanding of the findings of todays visit and agrees with plan of treatment. I have discussed any further diagnostic evaluation that may be needed or ordered today. We also reviewed her medications today. she has been encouraged to call the office with any questions or concerns that should arise related to todays visit.    Time spent:20 Minutes    Gilberto Better, [place] Physician Assistant

## 2023-08-18 NOTE — Therapy (Signed)
OUTPATIENT OCCUPATIONAL THERAPY ORTHO EVALUATION  Patient Name: Patricia Ramirez MRN: [id number] DOB:02/28/1996, 27 y.o., female Today's Date: 08/18/2023  PCP: Dr. Della Goo REFERRING PROVIDER: Janice Coffin, PA (with Dr. Sherryll Burger)  END OF SESSION:  OT End of Session - 08/18/23 0916     Visit Number 1    Number of Visits 12    Date for OT Re-Evaluation 11/10/23    Progress Note Due on Visit 10    OT Start Time 0800    OT Stop Time 0848    OT Time Calculation (min) 48 min    Activity Tolerance Patient tolerated treatment well    Behavior During Therapy [facility] for tasks assessed/performed            Past Medical History:  Diagnosis Date   Anemia    Graves' disease in remission    Type 1 diabetes (HCC)    Past Surgical History:  Procedure Laterality Date   COLONOSCOPY  2004   WISDOM TOOTH EXTRACTION  2019   Patient Active Problem List   Diagnosis Date Noted   Hypothyroidism 04/15/2023   Hives 04/15/2023   Stage 3a chronic kidney disease (HCC) 04/15/2023   Sickle cell trait (HCC) 11/25/2021   RBC microcytosis 05/16/2021   Iron deficiency anemia 05/16/2021   IUD (intrauterine device) in place 08/28/2019   Insulin pump status 01/13/2019   Herpes simplex infection 10/29/2017   Anemia 06/05/2014   Type 1 diabetes mellitus (HCC) 06/05/2014   ONSET DATE: Several years ago, worsening over the last year  REFERRING DIAG: G56.01 (ICD-10-CM) - Carpal tunnel syndrome, right upper limb   THERAPY DIAG:  Muscle weakness (generalized)  Carpal tunnel syndrome, right upper limb  Paresthesias in right hand  Paresthesias in left hand  Rationale for Evaluation and Treatment: Rehabilitation  SUBJECTIVE:  SUBJECTIVE STATEMENT: Pt reports tingling and pain in both hands, with the R being the most affected. Pt accompanied by: self  PERTINENT HISTORY:  Per medical record from Dr. Sherryll Burger on 01/19/23: Patient is a 27 year-old female who presents with bilateral hand numbness and  tinglig. She has the same tingling in her head. Does not have neck pain or stiffness. 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 Past medical history is significant for diabetes and thyroid problems.   NCS completed: Impression: This is an abnormal electrodiagnostic exam consistent with 1) Right minimal (grade I) carpal tunnel syndrome (median nerve entrapment at wrist). 2) No electrodiagnostic evidence of generalized peripheral neuropathy.   PRECAUTIONS: Other: Pt provided with initial education on cumulative trauma prevention/joint protection  RED FLAGS: None   WEIGHT BEARING RESTRICTIONS: No  PAIN:  Are you having pain? Yes: NPRS scale: 2-3 at rest, up to 8 /10 Pain location: bilat hands/wrists Pain description: pins/needles, tingling, persistent dull ache, can be sharp Aggravating factors: prolonged use of hands for [facility] tasks Relieving factors: Gabapentin helps the nerve pain, ice, soft brace   LIVING ENVIRONMENT: Lives with: lives with their spouse  PLOF: Independent, works full time as an Environmental health practitioner at [employer].  Leisure: enjoys crocheting and cross stitching  PATIENT GOALS: Decrease pain, improve symptoms  NEXT MD VISIT: Return in about 1 month to Dr. Sherryll Burger  OBJECTIVE:  Note: Objective measures were completed at Evaluation unless otherwise noted.  HAND DOMINANCE: Right  ADLs: Overall ADLs: Pain in bilat hands, often compensating with L non-dominant hand to perform tasks Transfers/ambulation related to ADLs: indep Eating: pain with wrist movement bringing utensils to mouth; pain when pinching to stabilize  a knife Grooming: can be painful to brush teeth and hair, difficulty holding hair dryer UB Dressing: difficulty with zippers at times LB Dressing: difficulty with zippers at times Toileting: occasional compensation with non-dominant hand to perform peri care Bathing: often compensates with non-dominant hand to wash Equipment: none  FUNCTIONAL  OUTCOME MEASURES: FOTO: 48 ; predicted 60  UPPER EXTREMITY ROM:     Active ROM Right eval Left eval  Shoulder flexion    Shoulder abduction    Shoulder adduction    Shoulder extension    Shoulder internal rotation    Shoulder external rotation    Elbow flexion    Elbow extension 62 70  Wrist flexion 86 90  Wrist extension    Wrist ulnar deviation    Wrist radial deviation    Wrist pronation 90 90  Wrist supination 80 90  (Blank rows = not tested)    *Able to make full composite fist, achieve full digit extension, and oppose all digits to thumb in bilat hands  UPPER EXTREMITY MMT:     MMT Right eval Left eval  Shoulder flexion    Shoulder abduction    Shoulder adduction    Shoulder extension    Shoulder internal rotation    Shoulder external rotation    Middle trapezius    Lower trapezius    Elbow flexion    Elbow extension    Wrist flexion 4- 4  Wrist extension 4- 4  Wrist ulnar deviation    Wrist radial deviation    Wrist pronation    Wrist supination    (Blank rows = not tested)  HAND FUNCTION: Grip strength: Right: 9 lbs; Left: 40 lbs, Lateral pinch: Right: 10 lbs, Left: 12 lbs, and 3 point pinch: Right: 6 lbs, Left: 10 lbs  COORDINATION: 9 Hole Peg test: Right: 24 sec; Left: 25 sec  SENSATION: Impaired bilat hands; pt reports diffuse numbness in hands, all digits.  "Hands stay numb for most of the time."  EDEMA: No visible edema  COGNITION: Overall cognitive status: Within functional limits for tasks assessed  OBSERVATIONS:  Pt pleasant, cooperative, receptive to tx plan and education provided.  TODAY'S TREATMENT:                                                                                                                              DATE: 08/18/23 Evaluation completed.  Self Care: Provided education on CTS, benefits of splinting to maintain neutral wrist positions with activity and while sleeping, initial joint protection/cumulative  trauma prevention strategies; additional education needed.  PATIENT EDUCATION: Education details: OT role, goals, poc, CTS, cumulative trauma prevention/joint protection Person educated: Patient Education method: Explanation, Verbal cues, and Handouts Education comprehension: verbalized understanding and needs further education  HOME EXERCISE PROGRAM: To be initiated next session  GOALS: Goals reviewed with patient? Yes  SHORT TERM GOALS: Target date: 09/01/23  Pt will be indep to perform HEP for improving bilat wrist and  hand strength and flexibility.  Baseline: Eval: HEP not yet initiated Goal status: INITIAL  2.  Pt will be indep to verbalize 2-3 joint protection/cumulative trauma prevention strategies to reduce pain/paresthesias in the R/L hands.   Baseline: Eval: Initiated educ at eval; additional training needed Goal status: INITIAL  LONG TERM GOALS: Target date: 11/10/2023  Pt will increase FOTO score to 60 or better to indicate improvement in self perceived functional use of the R hand with daily tasks. Baseline: Eval: 48 Goal status: INITIAL  2.  Pt will tolerate manual therapy, therapeutic modalities, and exercises to decrease pain in R hand  to a reported 3/10 pain or less with activity.   Baseline: Eval: R hand 2-3/10 at rest, up to 8/10 pain with activity Goal status: INITIAL  3.  Pt will increase R grip strength by 10 or more lbs in order to improve ability to hold and carry ADL supplies.    Baseline: Eval: R grip 9 lbs (dominant), L 40 lbs (non-dominant) Goal status: INITIAL  4.  Pt will be indep with donning/doffing and wearing schedule with carpal tunnel brace to manage pain/paresthesias in R hand. Baseline: Eval: Wearing a soft brace at night time (R hand); will likely need standard carpal tunnel brace (OT encouraged pt bring in current brace to next session for OT to assess appropriateness of brace) Goal status: INITIAL  ASSESSMENT:  CLINICAL  IMPRESSION: Patient is a 27 y.o. female who was seen today for occupational therapy evaluation for R carpal tunnel syndrome.  Pt presents with moderate-severe pain in R hand with activity, severe R hand weakness, mild R wrist weakness, and reporting constant paresthesias in bilat hands all digits, though R more pronounced than L.  Above symptoms impacting ADL performance as indicated by pt compensating with L non-dominant hand to perform most daily tasks.  Pt reports difficulty stabilizing a knife in the R hand, difficulty manipulating and holding grooming utensils (toothbrush, brush, hair dryer), and pain with R wrist mobility when bringing eating utensils to the mouth.  Additional difficulties noted with carrying ADL supplies and grocery bags in R dominant hand.  Pt works as an Environmental health practitioner at [employer], and also enjoys crocheting and cross stitching.  Pt is eager to be able to continue participation in work related tasks and leisure activities without discomfort in her hands.  Pt will benefit from skilled OT to provide education on cumulative trauma prevention, joint protection strategies, ADL and work activity modification, AE, splinting, pain management strategies, and therapeutic exercises in order to reduce pain/paresthesias and promote increased functional use of the R dominant hand for ADL and work related tasks.   PERFORMANCE DEFICITS: in functional skills including ADLs, IADLs, sensation, edema, ROM, strength, pain, flexibility, Fine motor control, body mechanics, decreased knowledge of precautions, decreased knowledge of use of DME, and UE functional use, and psychosocial skills including coping strategies, environmental adaptation, habits, and routines and behaviors.   IMPAIRMENTS: are limiting patient from ADLs, IADLs, rest and sleep, work, and leisure.   COMORBIDITIES: may have co-morbidities  that affects occupational performance. Patient will benefit from skilled OT to  address above impairments and improve overall function.  MODIFICATION OR ASSISTANCE TO COMPLETE EVALUATION: No modification of tasks or assist necessary to complete an evaluation.  OT OCCUPATIONAL PROFILE AND HISTORY: Problem focused assessment: Including review of records relating to presenting problem.  CLINICAL DECISION MAKING: Moderate - several treatment options, min-mod task modification necessary  REHAB POTENTIAL: Good  EVALUATION COMPLEXITY: Low  PLAN:  OT FREQUENCY: 1x/week  OT DURATION: up to 12 weeks  PLANNED INTERVENTIONS: 97168 OT Re-evaluation, 97535 self care/ADL training, 40981 therapeutic exercise, 97530 therapeutic activity, 97112 neuromuscular re-education, 97140 manual therapy, 97035 ultrasound, 97018 paraffin, 19147 moist heat, 97010 cryotherapy, 97034 contrast bath, 97033 iontophoresis, 97760 Orthotics management and training, 82956 Splinting (initial encounter), [id number] Subsequent splinting/medication, passive range of motion, psychosocial skills training, coping strategies training, patient/family education, and DME and/or AE instructions  RECOMMENDED OTHER SERVICES: None at this time  CONSULTED AND AGREED WITH PLAN OF CARE: Patient  PLAN FOR NEXT SESSION: Initiate HEP, assess appropriateness of current splint  Danelle Earthly, MS, OTR/L  Otis Dials, OT 08/18/2023, 9:18 AM

## 2023-08-24 ENCOUNTER — Ambulatory Visit: Payer: BC Managed Care – PPO

## 2023-08-24 DIAGNOSIS — G5601 Carpal tunnel syndrome, right upper limb: Secondary | ICD-10-CM | POA: Diagnosis not present

## 2023-08-24 DIAGNOSIS — R202 Paresthesia of skin: Secondary | ICD-10-CM

## 2023-08-24 DIAGNOSIS — M6281 Muscle weakness (generalized): Secondary | ICD-10-CM | POA: Diagnosis not present

## 2023-08-24 NOTE — Therapy (Signed)
OUTPATIENT OCCUPATIONAL THERAPY ORTHO TREATMENT NOTE  Patient Name: Patricia Ramirez MRN: [id number] DOB:11/11/1995, 27 y.o., female Today's Date: 08/24/2023  PCP: Dr. Della Goo REFERRING PROVIDER: Janice Coffin, PA (with Dr. Sherryll Burger)  END OF SESSION:  OT End of Session - 08/24/23 1240     Visit Number 2    Number of Visits 12    Date for OT Re-Evaluation 11/10/23    Progress Note Due on Visit 10    OT Start Time 0800    OT Stop Time 0845    OT Time Calculation (min) 45 min    Activity Tolerance Patient tolerated treatment well    Behavior During Therapy [facility] for tasks assessed/performed            Past Medical History:  Diagnosis Date   Anemia    Graves' disease in remission    Type 1 diabetes (HCC)    Past Surgical History:  Procedure Laterality Date   COLONOSCOPY  2004   WISDOM TOOTH EXTRACTION  2019   Patient Active Problem List   Diagnosis Date Noted   Hypothyroidism 04/15/2023   Hives 04/15/2023   Stage 3a chronic kidney disease (HCC) 04/15/2023   Sickle cell trait (HCC) 11/25/2021   RBC microcytosis 05/16/2021   Iron deficiency anemia 05/16/2021   IUD (intrauterine device) in place 08/28/2019   Insulin pump status 01/13/2019   Herpes simplex infection 10/29/2017   Anemia 06/05/2014   Type 1 diabetes mellitus (HCC) 06/05/2014   ONSET DATE: Several years ago, worsening over the last year  REFERRING DIAG: G56.01 (ICD-10-CM) - Carpal tunnel syndrome, right upper limb   THERAPY DIAG:  Muscle weakness (generalized)  Carpal tunnel syndrome, right upper limb  Paresthesias in right hand  Paresthesias in left hand  Rationale for Evaluation and Treatment: Rehabilitation  SUBJECTIVE:  SUBJECTIVE STATEMENT: Pt reports doing well today.   Pt accompanied by: self  PERTINENT HISTORY:  Per medical record from Dr. Sherryll Burger on 01/19/23: Patient is a 27 year-old female who presents with bilateral hand numbness and tinglig. She has the same tingling in her head.  Does not have neck pain or stiffness. 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 Past medical history is significant for diabetes and thyroid problems.   NCS completed: Impression: This is an abnormal electrodiagnostic exam consistent with 1) Right minimal (grade I) carpal tunnel syndrome (median nerve entrapment at wrist). 2) No electrodiagnostic evidence of generalized peripheral neuropathy.   PRECAUTIONS: Other: Pt provided with initial education on cumulative trauma prevention/joint protection  RED FLAGS: None   WEIGHT BEARING RESTRICTIONS: No  PAIN:  Are you having pain? Yes: NPRS scale: 2-3 at rest, up to 8 /10 Pain location: bilat hands/wrists Pain description: pins/needles, tingling, persistent dull ache, can be sharp Aggravating factors: prolonged use of hands for [facility] tasks Relieving factors: Gabapentin helps the nerve pain, ice, soft brace   LIVING ENVIRONMENT: Lives with: lives with their spouse  PLOF: Independent, works full time as an Environmental health practitioner at [employer].  Leisure: enjoys crocheting and cross stitching  PATIENT GOALS: Decrease pain, improve symptoms  NEXT MD VISIT: Return in about 1 month to Dr. Sherryll Burger  OBJECTIVE:  Note: Objective measures were completed at Evaluation unless otherwise noted.  HAND DOMINANCE: Right  ADLs: Overall ADLs: Pain in bilat hands, often compensating with L non-dominant hand to perform tasks Transfers/ambulation related to ADLs: indep Eating: pain with wrist movement bringing utensils to mouth; pain when pinching to stabilize a knife Grooming: can be painful to  brush teeth and hair, difficulty holding hair dryer UB Dressing: difficulty with zippers at times LB Dressing: difficulty with zippers at times Toileting: occasional compensation with non-dominant hand to perform peri care Bathing: often compensates with non-dominant hand to wash Equipment: none  FUNCTIONAL OUTCOME MEASURES: FOTO: 48 ; predicted 60  UPPER  EXTREMITY ROM:     Active ROM Right eval Left eval  Shoulder flexion    Shoulder abduction    Shoulder adduction    Shoulder extension    Shoulder internal rotation    Shoulder external rotation    Elbow flexion    Elbow extension 62 70  Wrist flexion 86 90  Wrist extension    Wrist ulnar deviation    Wrist radial deviation    Wrist pronation 90 90  Wrist supination 80 90  (Blank rows = not tested)    *Able to make full composite fist, achieve full digit extension, and oppose all digits to thumb in bilat hands  UPPER EXTREMITY MMT:     MMT Right eval Left eval  Shoulder flexion    Shoulder abduction    Shoulder adduction    Shoulder extension    Shoulder internal rotation    Shoulder external rotation    Middle trapezius    Lower trapezius    Elbow flexion    Elbow extension    Wrist flexion 4- 4  Wrist extension 4- 4  Wrist ulnar deviation    Wrist radial deviation    Wrist pronation    Wrist supination    (Blank rows = not tested)  HAND FUNCTION: Grip strength: Right: 9 lbs; Left: 40 lbs, Lateral pinch: Right: 10 lbs, Left: 12 lbs, and 3 point pinch: Right: 6 lbs, Left: 10 lbs  COORDINATION: 9 Hole Peg test: Right: 24 sec; Left: 25 sec  SENSATION: Impaired bilat hands; pt reports diffuse numbness in hands, all digits.  "Hands stay numb for most of the time."  EDEMA: No visible edema  COGNITION: Overall cognitive status: Within functional limits for tasks assessed  OBSERVATIONS:  Pt pleasant, cooperative, receptive to tx plan and education provided.  TODAY'S TREATMENT:                                                                                                                              DATE: 08/24/23 Moist heat applied to R/L hands for pain reduction/muscle relaxation intermittently throughout session and simultaneous to activities noted below.   Therapeutic Exercise: -Issued pink theraputty and instructed pt in strengthening and coordination  exercises for R hand, including gross grasping, lateral/2 point/3 point pinching, digit abd/add and thumb add.  Able to return demo with intermittent vc for technique to improve quality of movement.  Encouraged completion 5-10 min, 1-3x per day.   -Instructed in and completed full arm median nerve glide x5 on RUE, x3 LUE, min vc for form/technique. -Instructed in and completed wrist strengthening exercises for R wrist flex, ext, RD, and  pron/sup x1 set 10 reps with 1 lb dumbbell.   -Visual handouts issued and reviewed for above noted exercises  Self Care: Issued R medium carpal tunnel brace and advised on wearing scheduling.  Reviewed neutral wrist and elbow positions (pt endorses pain/paresthesias at times in ulnar fingers in either hand) with activity and while sleeping.  Educated pt pain management strategies, including review of contrast bath (handout issued), and making home ice pack to wrap around wrist/hand.  Written instruction provided.    PATIENT EDUCATION: Education details: HEP, splint wearing schedule (night time and during the day with work/home tasks (typing/crafting) Person educated: Patient Education method: Explanation, Verbal cues, and Handouts Education comprehension: verbalized understanding and needs further education  HOME EXERCISE PROGRAM: Pink theraputty, median nerve glides, 1# dumbbell for wrist strengthening  GOALS: Goals reviewed with patient? Yes  SHORT TERM GOALS: Target date: 09/01/23  Pt will be indep to perform HEP for improving bilat wrist and hand strength and flexibility.  Baseline: Eval: HEP not yet initiated Goal status: INITIAL  2.  Pt will be indep to verbalize 2-3 joint protection/cumulative trauma prevention strategies to reduce pain/paresthesias in the R/L hands.   Baseline: Eval: Initiated educ at eval; additional training needed Goal status: INITIAL  LONG TERM GOALS: Target date: 11/10/2023  Pt will increase FOTO score to 60 or better to  indicate improvement in self perceived functional use of the R hand with daily tasks. Baseline: Eval: 48 Goal status: INITIAL  2.  Pt will tolerate manual therapy, therapeutic modalities, and exercises to decrease pain in R hand  to a reported 3/10 pain or less with activity.   Baseline: Eval: R hand 2-3/10 at rest, up to 8/10 pain with activity Goal status: INITIAL  3.  Pt will increase R grip strength by 10 or more lbs in order to improve ability to hold and carry ADL supplies.    Baseline: Eval: R grip 9 lbs (dominant), L 40 lbs (non-dominant) Goal status: INITIAL  4.  Pt will be indep with donning/doffing and wearing schedule with carpal tunnel brace to manage pain/paresthesias in R hand. Baseline: Eval: Wearing a soft brace at night time (R hand); will likely need standard carpal tunnel brace (OT encouraged pt bring in current brace to next session for OT to assess appropriateness of brace) Goal status: INITIAL  ASSESSMENT:  CLINICAL IMPRESSION: Issued R medium carpal tunnel wrist brace and advised on wearing schedule.  HEP initiated this date, including use of pink theraputty, 1lb wrist strengthening, and median nerve glides, requiring min vc for form/technique.  Written handouts provided for carry over at home.  Pt to begin use of contrast bath and/or icing at home for symptom management; handouts provided.  Pt will continue to benefit from skilled OT to provide reinforcement on cumulative trauma prevention, joint protection strategies, ADL and work activity modification, AE, splinting, pain management strategies, and therapeutic exercises in order to reduce pain/paresthesias and promote increased functional use of the R dominant hand for ADL and work related tasks.   PERFORMANCE DEFICITS: in functional skills including ADLs, IADLs, sensation, edema, ROM, strength, pain, flexibility, Fine motor control, body mechanics, decreased knowledge of precautions, decreased knowledge of use of DME,  and UE functional use, and psychosocial skills including coping strategies, environmental adaptation, habits, and routines and behaviors.   IMPAIRMENTS: are limiting patient from ADLs, IADLs, rest and sleep, work, and leisure.   COMORBIDITIES: may have co-morbidities  that affects occupational performance. Patient will benefit from skilled OT to  address above impairments and improve overall function.  MODIFICATION OR ASSISTANCE TO COMPLETE EVALUATION: No modification of tasks or assist necessary to complete an evaluation.  OT OCCUPATIONAL PROFILE AND HISTORY: Problem focused assessment: Including review of records relating to presenting problem.  CLINICAL DECISION MAKING: Moderate - several treatment options, min-mod task modification necessary  REHAB POTENTIAL: Good  EVALUATION COMPLEXITY: Low      PLAN:  OT FREQUENCY: 1x/week  OT DURATION: up to 12 weeks  PLANNED INTERVENTIONS: 97168 OT Re-evaluation, 97535 self care/ADL training, 13086 therapeutic exercise, 97530 therapeutic activity, 97112 neuromuscular re-education, 97140 manual therapy, 97035 ultrasound, 97018 paraffin, 57846 moist heat, 97010 cryotherapy, 97034 contrast bath, 97033 iontophoresis, 97760 Orthotics management and training, 96295 Splinting (initial encounter), [id number] Subsequent splinting/medication, passive range of motion, psychosocial skills training, coping strategies training, patient/family education, and DME and/or AE instructions  RECOMMENDED OTHER SERVICES: None at this time  CONSULTED AND AGREED WITH PLAN OF CARE: Patient  PLAN FOR NEXT SESSION: Initiate HEP, assess appropriateness of current splint  Danelle Earthly, MS, OTR/L  Patricia Ramirez, OT 08/24/2023, 12:47 PM

## 2023-08-26 ENCOUNTER — Ambulatory Visit: Payer: BC Managed Care – PPO

## 2023-08-31 ENCOUNTER — Ambulatory Visit: Payer: BC Managed Care – PPO

## 2023-08-31 DIAGNOSIS — G5601 Carpal tunnel syndrome, right upper limb: Secondary | ICD-10-CM | POA: Diagnosis not present

## 2023-08-31 DIAGNOSIS — R202 Paresthesia of skin: Secondary | ICD-10-CM | POA: Diagnosis not present

## 2023-08-31 DIAGNOSIS — M6281 Muscle weakness (generalized): Secondary | ICD-10-CM

## 2023-08-31 NOTE — Therapy (Signed)
OUTPATIENT OCCUPATIONAL THERAPY ORTHO TREATMENT NOTE  Patient Name: Patricia Ramirez MRN: [id number] DOB:10/23/1996, 27 y.o., female Today's Date: 08/31/2023  PCP: Dr. Della Goo REFERRING PROVIDER: Janice Coffin, PA (with Dr. Sherryll Burger)  END OF SESSION:  OT End of Session - 08/31/23 0921     Visit Number 3    Number of Visits 12    Date for OT Re-Evaluation 11/10/23    Progress Note Due on Visit 10    OT Start Time 0758    OT Stop Time 0843    OT Time Calculation (min) 45 min    Activity Tolerance Patient tolerated treatment well    Behavior During Therapy [facility] for tasks assessed/performed            Past Medical History:  Diagnosis Date   Anemia    Graves' disease in remission    Type 1 diabetes (HCC)    Past Surgical History:  Procedure Laterality Date   COLONOSCOPY  2004   WISDOM TOOTH EXTRACTION  2019   Patient Active Problem List   Diagnosis Date Noted   Hypothyroidism 04/15/2023   Hives 04/15/2023   Stage 3a chronic kidney disease (HCC) 04/15/2023   Sickle cell trait (HCC) 11/25/2021   RBC microcytosis 05/16/2021   Iron deficiency anemia 05/16/2021   IUD (intrauterine device) in place 08/28/2019   Insulin pump status 01/13/2019   Herpes simplex infection 10/29/2017   Anemia 06/05/2014   Type 1 diabetes mellitus (HCC) 06/05/2014   ONSET DATE: Several years ago, worsening over the last year  REFERRING DIAG: G56.01 (ICD-10-CM) - Carpal tunnel syndrome, right upper limb   THERAPY DIAG:  Muscle weakness (generalized)  Carpal tunnel syndrome, right upper limb  Paresthesias in right hand  Paresthesias in left hand  Rationale for Evaluation and Treatment: Rehabilitation  SUBJECTIVE:  SUBJECTIVE STATEMENT: Pt reports she'll likely have a lot of typing to do at work today, so she will plan to wear her carpal tunnel brace more as able while working.   Pt accompanied by: self  PERTINENT HISTORY:  Per medical record from Dr. Sherryll Burger on 01/19/23: Patient is a  27 year-old female who presents with bilateral hand numbness and tinglig. She has the same tingling in her head. Does not have neck pain or stiffness. 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 Past medical history is significant for diabetes and thyroid problems.   NCS completed: Impression: This is an abnormal electrodiagnostic exam consistent with 1) Right minimal (grade I) carpal tunnel syndrome (median nerve entrapment at wrist). 2) No electrodiagnostic evidence of generalized peripheral neuropathy.   PRECAUTIONS: Other: Pt provided with initial education on cumulative trauma prevention/joint protection  RED FLAGS: None   WEIGHT BEARING RESTRICTIONS: No  PAIN: 4-6/10 pain R wrist/hand Are you having pain? Yes: NPRS scale: 2-3 at rest, up to 8 /10 Pain location: bilat hands/wrists Pain description: pins/needles, tingling, persistent dull ache, can be sharp Aggravating factors: prolonged use of hands for [facility] tasks Relieving factors: Gabapentin helps the nerve pain, ice, soft brace   LIVING ENVIRONMENT: Lives with: lives with their spouse  PLOF: Independent, works full time as an Environmental health practitioner at [employer].  Leisure: enjoys crocheting and cross stitching  PATIENT GOALS: Decrease pain, improve symptoms  NEXT MD VISIT: Return in about 1 month to Dr. Sherryll Burger  OBJECTIVE:  Note: Objective measures were completed at Evaluation unless otherwise noted.  HAND DOMINANCE: Right  ADLs: Overall ADLs: Pain in bilat hands, often compensating with L non-dominant hand to perform  tasks Transfers/ambulation related to ADLs: indep Eating: pain with wrist movement bringing utensils to mouth; pain when pinching to stabilize a knife Grooming: can be painful to brush teeth and hair, difficulty holding hair dryer UB Dressing: difficulty with zippers at times LB Dressing: difficulty with zippers at times Toileting: occasional compensation with non-dominant hand to perform peri  care Bathing: often compensates with non-dominant hand to wash Equipment: none  FUNCTIONAL OUTCOME MEASURES: FOTO: 48 ; predicted 60  UPPER EXTREMITY ROM:     Active ROM Right eval Left eval  Shoulder flexion    Shoulder abduction    Shoulder adduction    Shoulder extension    Shoulder internal rotation    Shoulder external rotation    Elbow flexion    Elbow extension 62 70  Wrist flexion 86 90  Wrist extension    Wrist ulnar deviation    Wrist radial deviation    Wrist pronation 90 90  Wrist supination 80 90  (Blank rows = not tested)    *Able to make full composite fist, achieve full digit extension, and oppose all digits to thumb in bilat hands  UPPER EXTREMITY MMT:     MMT Right eval Left eval  Shoulder flexion    Shoulder abduction    Shoulder adduction    Shoulder extension    Shoulder internal rotation    Shoulder external rotation    Middle trapezius    Lower trapezius    Elbow flexion    Elbow extension    Wrist flexion 4- 4  Wrist extension 4- 4  Wrist ulnar deviation    Wrist radial deviation    Wrist pronation    Wrist supination    (Blank rows = not tested)  HAND FUNCTION: Grip strength: Right: 9 lbs; Left: 40 lbs, Lateral pinch: Right: 10 lbs, Left: 12 lbs, and 3 point pinch: Right: 6 lbs, Left: 10 lbs  COORDINATION: 9 Hole Peg test: Right: 24 sec; Left: 25 sec  SENSATION: Impaired bilat hands; pt reports diffuse numbness in hands, all digits.  "Hands stay numb for most of the time."  EDEMA: No visible edema  COGNITION: Overall cognitive status: Within functional limits for tasks assessed  OBSERVATIONS:  Pt pleasant, cooperative, receptive to tx plan and education provided.  TODAY'S TREATMENT:                                                                                                                              DATE: 08/31/23 Moist heat applied to R hand for pain reduction/muscle relaxation intermittently throughout session.    Manual Therapy: Soft tissue massage performed at the R volar palm, targeting carpal tunnel region, all sides of wrist, and volar forearm, working to increase circulation and relax tight musculature at the wrist and forearm.   Therapeutic Exercise: Facilitated hand strengthening with use of hand gripper set at 6.6# to remove jumbo pegs from pegboard x3 trials using R hand.  Attempted 11.2#  for 1/2 of 2nd trial, but reduced back to 6.6# to avoid increased pain.   -Provided passive stretching at the wrist and digits between sets, as well as heat and massage for muscle relaxation.  Instructed in self passive stretching for wrist and digit extension using edge of table top, positioning in full elbow extension.  Encouraged this stretch routinely throughout the day d/t volar forearm flexor tightness.  Good return demo.   - Facilitated pinch strengthening with use of therapy resistant clothespins to target lateral pinch on the R hand.  Able to pinch yellow, red (light resistance) easily, and green (moderate resistance, completed with increased effort).  Performed 1 trial only, clipping pins on and removing from vertical dowel, with rest/stretch/heat before removing pins. -Performed passive palmar and radial abduction stretch, and grade 1 joint mobs for wrist (all planes) to alleviate pain/stiffness.  PATIENT EDUCATION: Education details: Encouraged increased use of carpal tunnel brace during work activities, specifically typing; encouraged routine icing or contrast bath and stretching throughout the day to reduce pain/stiffness in RUE. Person educated: Patient Education method: Explanation Education comprehension: verbalized understanding and needs further education  HOME EXERCISE PROGRAM: Pink theraputty, median nerve glides, 1# dumbbell for wrist strengthening, contrast bath/ice   GOALS: Goals reviewed with patient? Yes  SHORT TERM GOALS: Target date: 09/01/23  Pt will be indep to perform HEP  for improving bilat wrist and hand strength and flexibility.  Baseline: Eval: HEP not yet initiated Goal status: INITIAL  2.  Pt will be indep to verbalize 2-3 joint protection/cumulative trauma prevention strategies to reduce pain/paresthesias in the R/L hands.   Baseline: Eval: Initiated educ at eval; additional training needed Goal status: INITIAL  LONG TERM GOALS: Target date: 11/10/2023  Pt will increase FOTO score to 60 or better to indicate improvement in self perceived functional use of the R hand with daily tasks. Baseline: Eval: 48 Goal status: INITIAL  2.  Pt will tolerate manual therapy, therapeutic modalities, and exercises to decrease pain in R hand  to a reported 3/10 pain or less with activity.   Baseline: Eval: R hand 2-3/10 at rest, up to 8/10 pain with activity Goal status: INITIAL  3.  Pt will increase R grip strength by 10 or more lbs in order to improve ability to hold and carry ADL supplies.    Baseline: Eval: R grip 9 lbs (dominant), L 40 lbs (non-dominant) Goal status: INITIAL  4.  Pt will be indep with donning/doffing and wearing schedule with carpal tunnel brace to manage pain/paresthesias in R hand. Baseline: Eval: Wearing a soft brace at night time (R hand); will likely need standard carpal tunnel brace (OT encouraged pt bring in current brace to next session for OT to assess appropriateness of brace) Goal status: INITIAL  ASSESSMENT:  CLINICAL IMPRESSION: Pt reports consistent use of theraputty over the weekend, and doing a constrast bath at least 3 times since last OT session.  Pt verbalized some increase in pain following 10 min of theraputty use at home.  Encouraged reducing time to 5 min to prevent increases in pain.  Pt tolerated light resistance strengthening well this date for gripping and lateral pinching patterns, ensuring rest between sets, stretch, use of heat, and ongoing monitoring of pain to avoid worsening of symptoms.  Continued to reinforce  balance of strengthening the hand without overworking the hand, with encouragement to carefully monitor tolerance for all exercises.  Pt verbalizes some inconsistent use of carpal tunnel brace when typing, but anticipates having  to do a lot of typing at work today, so use of brace was recommended.  Pt agreed/receptive to recommendations.  Pt will continue to benefit from skilled OT to provide reinforcement on cumulative trauma prevention, joint protection strategies, ADL and work activity modification, AE, splinting, pain management strategies, and therapeutic exercises in order to reduce pain/paresthesias and promote increased functional use of the R dominant hand for ADL and work related tasks.   PERFORMANCE DEFICITS: in functional skills including ADLs, IADLs, sensation, edema, ROM, strength, pain, flexibility, Fine motor control, body mechanics, decreased knowledge of precautions, decreased knowledge of use of DME, and UE functional use, and psychosocial skills including coping strategies, environmental adaptation, habits, and routines and behaviors.   IMPAIRMENTS: are limiting patient from ADLs, IADLs, rest and sleep, work, and leisure.   COMORBIDITIES: may have co-morbidities  that affects occupational performance. Patient will benefit from skilled OT to address above impairments and improve overall function.  MODIFICATION OR ASSISTANCE TO COMPLETE EVALUATION: No modification of tasks or assist necessary to complete an evaluation.  OT OCCUPATIONAL PROFILE AND HISTORY: Problem focused assessment: Including review of records relating to presenting problem.  CLINICAL DECISION MAKING: Moderate - several treatment options, min-mod task modification necessary  REHAB POTENTIAL: Good  EVALUATION COMPLEXITY: Low      PLAN:  OT FREQUENCY: 1x/week  OT DURATION: up to 12 weeks  PLANNED INTERVENTIONS: 97168 OT Re-evaluation, 97535 self care/ADL training, 16109 therapeutic exercise, 97530  therapeutic activity, 97112 neuromuscular re-education, 97140 manual therapy, 97035 ultrasound, 97018 paraffin, 60454 moist heat, 97010 cryotherapy, 97034 contrast bath, 97033 iontophoresis, 97760 Orthotics management and training, 09811 Splinting (initial encounter), [id number] Subsequent splinting/medication, passive range of motion, psychosocial skills training, coping strategies training, patient/family education, and DME and/or AE instructions  RECOMMENDED OTHER SERVICES: None at this time  CONSULTED AND AGREED WITH PLAN OF CARE: Patient  PLAN FOR NEXT SESSION: Initiate HEP, assess appropriateness of current splint  Danelle Earthly, MS, OTR/L  Otis Dials, OT 08/31/2023, 9:22 AM

## 2023-09-07 ENCOUNTER — Ambulatory Visit: Payer: BC Managed Care – PPO | Attending: Student

## 2023-09-07 DIAGNOSIS — M6281 Muscle weakness (generalized): Secondary | ICD-10-CM | POA: Diagnosis not present

## 2023-09-07 DIAGNOSIS — R202 Paresthesia of skin: Secondary | ICD-10-CM | POA: Insufficient documentation

## 2023-09-07 DIAGNOSIS — G5601 Carpal tunnel syndrome, right upper limb: Secondary | ICD-10-CM | POA: Diagnosis not present

## 2023-09-07 NOTE — Therapy (Signed)
OUTPATIENT OCCUPATIONAL THERAPY ORTHO TREATMENT NOTE  Patient Name: Patricia Ramirez MRN: [id number] DOB:12/12/1995, 27 y.o., female Today's Date: 09/07/2023  PCP: Dr. Della Goo REFERRING PROVIDER: Janice Coffin, PA (with Dr. Sherryll Burger)  END OF SESSION:  OT End of Session - 09/07/23 0807     Visit Number 4    Number of Visits 12    Date for OT Re-Evaluation 11/10/23    Progress Note Due on Visit 10    OT Start Time 0800    OT Stop Time 0840    OT Time Calculation (min) 40 min    Activity Tolerance Patient tolerated treatment well    Behavior During Therapy [facility] for tasks assessed/performed            Past Medical History:  Diagnosis Date   Anemia    Graves' disease in remission    Type 1 diabetes (HCC)    Past Surgical History:  Procedure Laterality Date   COLONOSCOPY  2004   WISDOM TOOTH EXTRACTION  2019   Patient Active Problem List   Diagnosis Date Noted   Hypothyroidism 04/15/2023   Hives 04/15/2023   Stage 3a chronic kidney disease (HCC) 04/15/2023   Sickle cell trait (HCC) 11/25/2021   RBC microcytosis 05/16/2021   Iron deficiency anemia 05/16/2021   IUD (intrauterine device) in place 08/28/2019   Insulin pump status 01/13/2019   Herpes simplex infection 10/29/2017   Anemia 06/05/2014   Type 1 diabetes mellitus (HCC) 06/05/2014   ONSET DATE: Several years ago, worsening over the last year  REFERRING DIAG: G56.01 (ICD-10-CM) - Carpal tunnel syndrome, right upper limb   THERAPY DIAG:  Carpal tunnel syndrome, right upper limb  Paresthesias in right hand  Paresthesias in left hand  Muscle weakness (generalized)  Rationale for Evaluation and Treatment: Rehabilitation  SUBJECTIVE:  SUBJECTIVE STATEMENT: Pt reports reducing reps with putty has helped pain slightly, but still present and moderate (see below). Pt accompanied by: self  PERTINENT HISTORY:  Per medical record from Dr. Sherryll Burger on 01/19/23: Patient is a 27 year-old female who presents with  bilateral hand numbness and tinglig. She has the same tingling in her head. Does not have neck pain or stiffness. 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 Past medical history is significant for diabetes and thyroid problems.   NCS completed: Impression: This is an abnormal electrodiagnostic exam consistent with 1) Right minimal (grade I) carpal tunnel syndrome (median nerve entrapment at wrist). 2) No electrodiagnostic evidence of generalized peripheral neuropathy.   PRECAUTIONS: Other: Pt provided with initial education on cumulative trauma prevention/joint protection  RED FLAGS: None   WEIGHT BEARING RESTRICTIONS: No  PAIN: 09/07/23: 3/10 at rest, 6/10 with activity in the R wrist and hand Are you having pain? Yes: NPRS scale: 2-3 at rest, up to 8 /10 Pain location: bilat hands/wrists Pain description: pins/needles, tingling, persistent dull ache, can be sharp Aggravating factors: prolonged use of hands for [facility] tasks Relieving factors: Gabapentin helps the nerve pain, ice, soft brace   LIVING ENVIRONMENT: Lives with: lives with their spouse  PLOF: Independent, works full time as an Environmental health practitioner at [employer].  Leisure: enjoys crocheting and cross stitching  PATIENT GOALS: Decrease pain, improve symptoms  NEXT MD VISIT: Return in about 1 month to Dr. Sherryll Burger  OBJECTIVE:  Note: Objective measures were completed at Evaluation unless otherwise noted.  HAND DOMINANCE: Right  ADLs: Overall ADLs: Pain in bilat hands, often compensating with L non-dominant hand to perform tasks Transfers/ambulation related to ADLs:  indep Eating: pain with wrist movement bringing utensils to mouth; pain when pinching to stabilize a knife Grooming: can be painful to brush teeth and hair, difficulty holding hair dryer UB Dressing: difficulty with zippers at times LB Dressing: difficulty with zippers at times Toileting: occasional compensation with non-dominant hand to perform peri  care Bathing: often compensates with non-dominant hand to wash Equipment: none  FUNCTIONAL OUTCOME MEASURES: FOTO: 48 ; predicted 60  UPPER EXTREMITY ROM:     Active ROM Right eval Left eval  Shoulder flexion    Shoulder abduction    Shoulder adduction    Shoulder extension    Shoulder internal rotation    Shoulder external rotation    Elbow flexion    Elbow extension 62 70  Wrist flexion 86 90  Wrist extension    Wrist ulnar deviation    Wrist radial deviation    Wrist pronation 90 90  Wrist supination 80 90  (Blank rows = not tested)    *Able to make full composite fist, achieve full digit extension, and oppose all digits to thumb in bilat hands  UPPER EXTREMITY MMT:     MMT Right eval Left eval  Shoulder flexion    Shoulder abduction    Shoulder adduction    Shoulder extension    Shoulder internal rotation    Shoulder external rotation    Middle trapezius    Lower trapezius    Elbow flexion    Elbow extension    Wrist flexion 4- 4  Wrist extension 4- 4  Wrist ulnar deviation    Wrist radial deviation    Wrist pronation    Wrist supination    (Blank rows = not tested)  HAND FUNCTION: Grip strength: Right: 9 lbs; Left: 40 lbs, Lateral pinch: Right: 10 lbs, Left: 12 lbs, and 3 point pinch: Right: 6 lbs, Left: 10 lbs  COORDINATION: 9 Hole Peg test: Right: 24 sec; Left: 25 sec  SENSATION: Impaired bilat hands; pt reports diffuse numbness in hands, all digits.  "Hands stay numb for most of the time."  EDEMA: No visible edema  COGNITION: Overall cognitive status: Within functional limits for tasks assessed  OBSERVATIONS:  Pt pleasant, cooperative, receptive to tx plan and education provided.  TODAY'S TREATMENT:                                                                                                                              DATE: 09/07/23 Contrast Bath: 15 min Alternating 3 min moist heat then 1 min cold pack x3 rounds, beginning and  ending with 3 min moist heat to R wrist/hand, working to increase circulation/decrease pain/decrease swelling in the R carpal tunnel region.  Therapeutic Exercise: HEP review with good return demo of passive bilat wrist flex and extension stretching, elbow extended.  Good return demo of median nerve glides bilaterally.  Discussed HEP changes for this week; OT encouraging pt to take a break from the theraputty  and dumbbells for wrist strengthening to focus on icing, use of wrist brace, and stretching d/t pain levels still being mild-moderate.  Pt in agreement.  Self Care: Reviewed ergonomic work space/neutral UE positions at [employer]; Psychologist, counselling.  Reviewed cumulative trauma prevention handout, reinforcing neutral wrist positions, using larger muscle groups to hold/carry supplies when able, alternating activity with rest and stretch breaks, and limiting forceful and repetitive gripping.  Issued built up foam handles/grips with recommendation to use on eating utensils, grooming utensils, pen/pencils, and crochet hook.  Pt had crochet supplies with her today for OT to assess body mechanics.  Encouraged use of wrist brace when crocheting, and practiced manipulation of crochet hook with built up foam grip.  Pt agreed hook was easier to hold to with a built up handle and agreed to try brace when crocheting.  Recommendation made for wearing carpal tunnel brace 24/7 this week, removing only to stretch, wash hands, bathe, etc.  Pt agreed/receptive to all recommendations.    PATIENT EDUCATION: Education details: HEP review, cumulative trauma preventions strategies.  Person educated: Patient Education method: Explanation, visual handouts Education comprehension: verbalized understanding and needs further education  HOME EXERCISE PROGRAM: Pink theraputty, median nerve glides, 1# dumbbell for wrist strengthening, contrast bath/ice   GOALS: Goals reviewed with patient? Yes  SHORT TERM GOALS: Target  date: 09/01/23  Pt will be indep to perform HEP for improving bilat wrist and hand strength and flexibility.  Baseline: Eval: HEP not yet initiated Goal status: INITIAL  2.  Pt will be indep to verbalize 2-3 joint protection/cumulative trauma prevention strategies to reduce pain/paresthesias in the R/L hands.   Baseline: Eval: Initiated educ at eval; additional training needed Goal status: INITIAL  LONG TERM GOALS: Target date: 11/10/2023  Pt will increase FOTO score to 60 or better to indicate improvement in self perceived functional use of the R hand with daily tasks. Baseline: Eval: 48 Goal status: INITIAL  2.  Pt will tolerate manual therapy, therapeutic modalities, and exercises to decrease pain in R hand  to a reported 3/10 pain or less with activity.   Baseline: Eval: R hand 2-3/10 at rest, up to 8/10 pain with activity Goal status: INITIAL  3.  Pt will increase R grip strength by 10 or more lbs in order to improve ability to hold and carry ADL supplies.    Baseline: Eval: R grip 9 lbs (dominant), L 40 lbs (non-dominant) Goal status: INITIAL  4.  Pt will be indep with donning/doffing and wearing schedule with carpal tunnel brace to manage pain/paresthesias in R hand. Baseline: Eval: Wearing a soft brace at night time (R hand); will likely need standard carpal tunnel brace (OT encouraged pt bring in current brace to next session for OT to assess appropriateness of brace) Goal status: INITIAL  ASSESSMENT:  CLINICAL IMPRESSION: Pt reports reducing reps with putty has helped pain slightly, but still present and moderate.  Pt reported 3/10 pain at rest in R hand, and up to 6/10 pain with activity, though more tolerable and under control in the L hand.  Pt receptive to holding off on theraputty exercises and dumbbell exercises for wrist strengthening this week and to focus on icing, stretching, and use of carpal tunnel wrist braces 24/7 (except with bathing/hand hygiene/etc).  Pt  verbalized good understanding of cumulative trauma prevention strategies, activity modification, and use of built up foam grips to promote wider/looser grasps on pens/grooming utensils/eating utensils, and crochet hook.  Pt will continue to benefit  from skilled OT to provide reinforcement on cumulative trauma prevention, joint protection strategies, ADL and work activity modification, AE, splinting, pain management strategies, and therapeutic exercises in order to reduce pain/paresthesias and promote increased functional use of the R dominant hand for ADL and work related tasks.   PERFORMANCE DEFICITS: in functional skills including ADLs, IADLs, sensation, edema, ROM, strength, pain, flexibility, Fine motor control, body mechanics, decreased knowledge of precautions, decreased knowledge of use of DME, and UE functional use, and psychosocial skills including coping strategies, environmental adaptation, habits, and routines and behaviors.   IMPAIRMENTS: are limiting patient from ADLs, IADLs, rest and sleep, work, and leisure.   COMORBIDITIES: may have co-morbidities  that affects occupational performance. Patient will benefit from skilled OT to address above impairments and improve overall function.  MODIFICATION OR ASSISTANCE TO COMPLETE EVALUATION: No modification of tasks or assist necessary to complete an evaluation.  OT OCCUPATIONAL PROFILE AND HISTORY: Problem focused assessment: Including review of records relating to presenting problem.  CLINICAL DECISION MAKING: Moderate - several treatment options, min-mod task modification necessary  REHAB POTENTIAL: Good  EVALUATION COMPLEXITY: Low      PLAN:  OT FREQUENCY: 1x/week  OT DURATION: up to 12 weeks  PLANNED INTERVENTIONS: 97168 OT Re-evaluation, 97535 self care/ADL training, 19147 therapeutic exercise, 97530 therapeutic activity, 97112 neuromuscular re-education, 97140 manual therapy, 97035 ultrasound, 97018 paraffin, 82956 moist  heat, 97010 cryotherapy, 97034 contrast bath, 97033 iontophoresis, 97760 Orthotics management and training, 21308 Splinting (initial encounter), [id number] Subsequent splinting/medication, passive range of motion, psychosocial skills training, coping strategies training, patient/family education, and DME and/or AE instructions  RECOMMENDED OTHER SERVICES: None at this time  CONSULTED AND AGREED WITH PLAN OF CARE: Patient  PLAN FOR NEXT SESSION: see above  Danelle Earthly, MS, OTR/L  Otis Dials, OT 09/07/2023, 10:38 AM

## 2023-09-10 ENCOUNTER — Ambulatory Visit: Payer: BC Managed Care – PPO

## 2023-09-13 ENCOUNTER — Ambulatory Visit: Payer: BC Managed Care – PPO

## 2023-09-13 DIAGNOSIS — M6281 Muscle weakness (generalized): Secondary | ICD-10-CM

## 2023-09-13 DIAGNOSIS — G5601 Carpal tunnel syndrome, right upper limb: Secondary | ICD-10-CM | POA: Diagnosis not present

## 2023-09-13 DIAGNOSIS — R202 Paresthesia of skin: Secondary | ICD-10-CM

## 2023-09-13 NOTE — Therapy (Signed)
OUTPATIENT OCCUPATIONAL THERAPY ORTHO TREATMENT NOTE  Patient Name: Patricia Ramirez MRN: [id number] DOB:10/14/1996, 27 y.o., female Today's Date: 09/13/2023  PCP: Dr. Della Goo REFERRING PROVIDER: Janice Coffin, PA (with Dr. Sherryll Burger)  END OF SESSION:  OT End of Session - 09/13/23 0808     Visit Number 5    Number of Visits 12    Date for OT Re-Evaluation 11/10/23    Progress Note Due on Visit 10    OT Start Time 0800    OT Stop Time 0845    OT Time Calculation (min) 45 min    Activity Tolerance Patient tolerated treatment well    Behavior During Therapy [facility] for tasks assessed/performed            Past Medical History:  Diagnosis Date   Anemia    Graves' disease in remission    Type 1 diabetes (HCC)    Past Surgical History:  Procedure Laterality Date   COLONOSCOPY  2004   WISDOM TOOTH EXTRACTION  2019   Patient Active Problem List   Diagnosis Date Noted   Hypothyroidism 04/15/2023   Hives 04/15/2023   Stage 3a chronic kidney disease (HCC) 04/15/2023   Sickle cell trait (HCC) 11/25/2021   RBC microcytosis 05/16/2021   Iron deficiency anemia 05/16/2021   IUD (intrauterine device) in place 08/28/2019   Insulin pump status 01/13/2019   Herpes simplex infection 10/29/2017   Anemia 06/05/2014   Type 1 diabetes mellitus (HCC) 06/05/2014   ONSET DATE: Several years ago, worsening over the last year  REFERRING DIAG: G56.01 (ICD-10-CM) - Carpal tunnel syndrome, right upper limb   THERAPY DIAG:  Muscle weakness (generalized)  Paresthesias in right hand  Paresthesias in left hand  Carpal tunnel syndrome, right upper limb  Rationale for Evaluation and Treatment: Rehabilitation  SUBJECTIVE:  SUBJECTIVE STATEMENT: Pt reports icing most of the nights during the work week this past week.   Pt accompanied by: self  PERTINENT HISTORY:  Per medical record from Dr. Sherryll Burger on 01/19/23: Patient is a 27 year-old female who presents with bilateral hand numbness and  tinglig. She has the same tingling in her head. Does not have neck pain or stiffness. 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 Past medical history is significant for diabetes and thyroid problems.   NCS completed: Impression: This is an abnormal electrodiagnostic exam consistent with 1) Right minimal (grade I) carpal tunnel syndrome (median nerve entrapment at wrist). 2) No electrodiagnostic evidence of generalized peripheral neuropathy.   PRECAUTIONS: Other: Pt provided with initial education on cumulative trauma prevention/joint protection  RED FLAGS: None   WEIGHT BEARING RESTRICTIONS: No  PAIN: 09/13/23: 2-3 at rest, 4/10 with activity (R hand) Are you having pain? Yes: NPRS scale: 2-3 at rest, up to 8 /10 Pain location: bilat hands/wrists Pain description: pins/needles, tingling, persistent dull ache, can be sharp Aggravating factors: prolonged use of hands for [facility] tasks Relieving factors: Gabapentin helps the nerve pain, ice, soft brace   LIVING ENVIRONMENT: Lives with: lives with their spouse  PLOF: Independent, works full time as an Environmental health practitioner at [employer].  Leisure: enjoys crocheting and cross stitching  PATIENT GOALS: Decrease pain, improve symptoms  NEXT MD VISIT: Return in about 1 month to Dr. Sherryll Burger  OBJECTIVE:  Note: Objective measures were completed at Evaluation unless otherwise noted.  HAND DOMINANCE: Right  ADLs: Overall ADLs: Pain in bilat hands, often compensating with L non-dominant hand to perform tasks Transfers/ambulation related to ADLs: indep Eating: pain with wrist  movement bringing utensils to mouth; pain when pinching to stabilize a knife Grooming: can be painful to brush teeth and hair, difficulty holding hair dryer UB Dressing: difficulty with zippers at times LB Dressing: difficulty with zippers at times Toileting: occasional compensation with non-dominant hand to perform peri care Bathing: often compensates with  non-dominant hand to wash Equipment: none  FUNCTIONAL OUTCOME MEASURES: FOTO: 48 ; predicted 60  UPPER EXTREMITY ROM:     Active ROM Right eval Left eval  Shoulder flexion    Shoulder abduction    Shoulder adduction    Shoulder extension    Shoulder internal rotation    Shoulder external rotation    Elbow flexion    Elbow extension 62 70  Wrist flexion 86 90  Wrist extension    Wrist ulnar deviation    Wrist radial deviation    Wrist pronation 90 90  Wrist supination 80 90  (Blank rows = not tested)    *Able to make full composite fist, achieve full digit extension, and oppose all digits to thumb in bilat hands  UPPER EXTREMITY MMT:     MMT Right eval Left eval  Shoulder flexion    Shoulder abduction    Shoulder adduction    Shoulder extension    Shoulder internal rotation    Shoulder external rotation    Middle trapezius    Lower trapezius    Elbow flexion    Elbow extension    Wrist flexion 4- 4  Wrist extension 4- 4  Wrist ulnar deviation    Wrist radial deviation    Wrist pronation    Wrist supination    (Blank rows = not tested)  HAND FUNCTION: Grip strength: Right: 9 lbs; Left: 40 lbs, Lateral pinch: Right: 10 lbs, Left: 12 lbs, and 3 point pinch: Right: 6 lbs, Left: 10 lbs  COORDINATION: 9 Hole Peg test: Right: 24 sec; Left: 25 sec  SENSATION: Impaired bilat hands; pt reports diffuse numbness in hands, all digits.  "Hands stay numb for most of the time."  EDEMA: No visible edema  COGNITION: Overall cognitive status: Within functional limits for tasks assessed  OBSERVATIONS:  Pt pleasant, cooperative, receptive to tx plan and education provided.  TODAY'S TREATMENT:                                                                                                                              DATE: 09/13/23 Paraffin:  X10 min to R/L hands for pain management/muscle relaxation.  Manual Therapy: Soft tissue massage performed at the R volar  palm, targeting carpal tunnel region, all sides of wrist, and volar forearm, working to increase circulation and relax tight musculature at the wrist and forearm.  Targeted R thenar eminence with edema massage technique d/t visible mild edema noted.    Therapeutic Activity: Pt with tenderness at the R radial wrist/base of thumb.  Instructed pt in "DeQuervain's stretch," tucking thumb and ulnarly deviating R wrist.  Trialed small R compression glove with open fingers to decrease swelling at the base of the R thumb and thenar eminence, but small did not appear to fit appropriately.  Pt will likely need a medium or large, though not currently stocked in clinic.  OT offered additional options to obtain vs waiting for shipment.    PATIENT EDUCATION: Education details: HEP review, cumulative trauma preventions strategies.  Person educated: Patient Education method: Explanation, visual handouts Education comprehension: verbalized understanding and needs further education  HOME EXERCISE PROGRAM: Pink theraputty, median nerve glides, 1# dumbbell for wrist strengthening, contrast bath/ice   GOALS: Goals reviewed with patient? Yes  SHORT TERM GOALS: Target date: 09/01/23  Pt will be indep to perform HEP for improving bilat wrist and hand strength and flexibility.  Baseline: Eval: HEP not yet initiated Goal status: INITIAL  2.  Pt will be indep to verbalize 2-3 joint protection/cumulative trauma prevention strategies to reduce pain/paresthesias in the R/L hands.   Baseline: Eval: Initiated educ at eval; additional training needed Goal status: INITIAL  LONG TERM GOALS: Target date: 11/10/2023  Pt will increase FOTO score to 60 or better to indicate improvement in self perceived functional use of the R hand with daily tasks. Baseline: Eval: 48 Goal status: INITIAL  2.  Pt will tolerate manual therapy, therapeutic modalities, and exercises to decrease pain in R hand  to a reported 3/10 pain or less  with activity.   Baseline: Eval: R hand 2-3/10 at rest, up to 8/10 pain with activity Goal status: INITIAL  3.  Pt will increase R grip strength by 10 or more lbs in order to improve ability to hold and carry ADL supplies.    Baseline: Eval: R grip 9 lbs (dominant), L 40 lbs (non-dominant) Goal status: INITIAL  4.  Pt will be indep with donning/doffing and wearing schedule with carpal tunnel brace to manage pain/paresthesias in R hand. Baseline: Eval: Wearing a soft brace at night time (R hand); will likely need standard carpal tunnel brace (OT encouraged pt bring in current brace to next session for OT to assess appropriateness of brace) Goal status: INITIAL  ASSESSMENT:  CLINICAL IMPRESSION: Pain in R hand with activity slightly less this week.  Pt reports not being able to type efficiently at work with use of brace, but reports she has been wearing the brace otherwise as recommended during the day and night time.  Pt also reports increased frequency with icing after work.  Noted mild visible edema in R thumb thenar eminence, and tolerated edema massage well to this area this date.  Trialed small open fingered compression glove, but pt likely needs a larger size which is not currently stocked in clinic.  OT advised on alternate options; pt will let OT know if she chooses to obtain glove on her own or wait for new shipment of alternate sizes.  Encouraged pt continue to rest, ice, and stretch this week, avoiding resistive exercises until pain with activity improves.  Pt will continue to benefit from skilled OT to provide reinforcement on cumulative trauma prevention, joint protection strategies, ADL and work activity modification, AE, splinting, pain management strategies, and therapeutic exercises in order to reduce pain/paresthesias and promote increased functional use of the R dominant hand for ADL and work related tasks.   PERFORMANCE DEFICITS: in functional skills including ADLs, IADLs,  sensation, edema, ROM, strength, pain, flexibility, Fine motor control, body mechanics, decreased knowledge of precautions, decreased knowledge of use of DME, and UE functional use,  and psychosocial skills including coping strategies, environmental adaptation, habits, and routines and behaviors.   IMPAIRMENTS: are limiting patient from ADLs, IADLs, rest and sleep, work, and leisure.   COMORBIDITIES: may have co-morbidities  that affects occupational performance. Patient will benefit from skilled OT to address above impairments and improve overall function.  MODIFICATION OR ASSISTANCE TO COMPLETE EVALUATION: No modification of tasks or assist necessary to complete an evaluation.  OT OCCUPATIONAL PROFILE AND HISTORY: Problem focused assessment: Including review of records relating to presenting problem.  CLINICAL DECISION MAKING: Moderate - several treatment options, min-mod task modification necessary  REHAB POTENTIAL: Good  EVALUATION COMPLEXITY: Low      PLAN:  OT FREQUENCY: 1x/week  OT DURATION: up to 12 weeks  PLANNED INTERVENTIONS: 97168 OT Re-evaluation, 97535 self care/ADL training, 51884 therapeutic exercise, 97530 therapeutic activity, 97112 neuromuscular re-education, 97140 manual therapy, 97035 ultrasound, 97018 paraffin, 16606 moist heat, 97010 cryotherapy, 97034 contrast bath, 97033 iontophoresis, 97760 Orthotics management and training, 30160 Splinting (initial encounter), [id number] Subsequent splinting/medication, passive range of motion, psychosocial skills training, coping strategies training, patient/family education, and DME and/or AE instructions  RECOMMENDED OTHER SERVICES: None at this time  CONSULTED AND AGREED WITH PLAN OF CARE: Patient  PLAN FOR NEXT SESSION: see above  Danelle Earthly, MS, OTR/L  Otis Dials, OT 09/13/2023, 12:07 PM

## 2023-09-15 ENCOUNTER — Ambulatory Visit: Payer: BC Managed Care – PPO

## 2023-09-16 ENCOUNTER — Encounter: Payer: Self-pay | Admitting: Nurse Practitioner

## 2023-09-16 NOTE — Telephone Encounter (Signed)
Please schedule appointment.

## 2023-09-20 ENCOUNTER — Ambulatory Visit: Payer: BC Managed Care – PPO

## 2023-09-20 DIAGNOSIS — E109 Type 1 diabetes mellitus without complications: Secondary | ICD-10-CM | POA: Diagnosis not present

## 2023-09-20 DIAGNOSIS — Z794 Long term (current) use of insulin: Secondary | ICD-10-CM | POA: Diagnosis not present

## 2023-09-21 NOTE — Progress Notes (Unsigned)
   There were no vitals taken for this visit.   Subjective:    Patient ID: Patricia Ramirez, female    DOB: 05/12/1996, 27 y.o.   MRN: [id number]  HPI: Patricia Ramirez is a 27 y.o. female  No chief complaint on file.   Relevant past medical, surgical, family and social history reviewed and updated as indicated. Interim medical history since our last visit reviewed. Allergies and medications reviewed and updated.  Review of Systems  Constitutional: Negative for fever or weight change.  Respiratory: Negative for cough and shortness of breath.   Cardiovascular: Negative for chest pain or palpitations.  Gastrointestinal: Negative for abdominal pain, no bowel changes.  Musculoskeletal: Negative for gait problem or joint swelling.  Skin: Negative for rash.  Neurological: Negative for dizziness or headache.  No other specific complaints in a complete review of systems (except as listed in HPI above).      Objective:    There were no vitals taken for this visit.  Wt Readings from Last 3 Encounters:  08/18/23 185 lb (83.9 kg)  06/04/23 189 lb (85.7 kg)  05/27/23 187 lb 8 oz (85 kg)    Physical Exam  Constitutional: Patient appears well-developed and well-nourished. Obese *** No distress.  HEENT: head atraumatic, normocephalic, pupils equal and reactive to light, ears ***, neck supple, throat within normal limits Cardiovascular: Normal rate, regular rhythm and normal heart sounds.  No murmur heard. No BLE edema. Pulmonary/Chest: Effort normal and breath sounds normal. No respiratory distress. Abdominal: Soft.  There is no tenderness. Psychiatric: Patient has a normal mood and affect. behavior is normal. Judgment and thought content normal.  Results for orders placed or performed in visit on 05/26/23  Iron and TIBC  Result Value Ref Range   Iron 66 28 - 170 ug/dL   TIBC 782 956 - 213 ug/dL   Saturation Ratios 21 10.4 - 31.8 %   UIBC 249 ug/dL  Ferritin  Result Value Ref Range    Ferritin 58 11 - 307 ng/mL  CBC with Differential/Platelet  Result Value Ref Range   WBC 6.6 4.0 - 10.5 K/uL   RBC 4.40 3.87 - 5.11 MIL/uL   Hemoglobin 12.6 12.0 - 15.0 g/dL   HCT 08.6 57.8 - 46.9 %   MCV 84.1 80.0 - 100.0 fL   MCH 28.6 26.0 - 34.0 pg   MCHC 34.1 30.0 - 36.0 g/dL   RDW 62.9 52.8 - 41.3 %   Platelets 282 150 - 400 K/uL   nRBC 0.0 0.0 - 0.2 %   Neutrophils Relative % 51 %   Neutro Abs 3.4 1.7 - 7.7 K/uL   Lymphocytes Relative 35 %   Lymphs Abs 2.3 0.7 - 4.0 K/uL   Monocytes Relative 9 %   Monocytes Absolute 0.6 0.1 - 1.0 K/uL   Eosinophils Relative 3 %   Eosinophils Absolute 0.2 0.0 - 0.5 K/uL   Basophils Relative 1 %   Basophils Absolute 0.0 0.0 - 0.1 K/uL   Immature Granulocytes 1 %   Abs Immature Granulocytes 0.04 0.00 - 0.07 K/uL      Assessment & Plan:   Problem List Items Addressed This Visit   None    Follow up plan: No follow-ups on file.

## 2023-09-22 ENCOUNTER — Other Ambulatory Visit: Payer: Self-pay

## 2023-09-22 ENCOUNTER — Ambulatory Visit: Payer: BC Managed Care – PPO

## 2023-09-22 ENCOUNTER — Ambulatory Visit: Payer: BC Managed Care – PPO | Admitting: Nurse Practitioner

## 2023-09-22 ENCOUNTER — Encounter: Payer: Self-pay | Admitting: Nurse Practitioner

## 2023-09-22 VITALS — BP 118/72 | HR 97 | Temp 97.7°F | Resp 16 | Ht 64.5 in | Wt 180.1 lb

## 2023-09-22 DIAGNOSIS — Z0289 Encounter for other administrative examinations: Secondary | ICD-10-CM

## 2023-09-22 DIAGNOSIS — E1040 Type 1 diabetes mellitus with diabetic neuropathy, unspecified: Secondary | ICD-10-CM

## 2023-09-23 DIAGNOSIS — G5601 Carpal tunnel syndrome, right upper limb: Secondary | ICD-10-CM | POA: Diagnosis not present

## 2023-09-23 DIAGNOSIS — R200 Anesthesia of skin: Secondary | ICD-10-CM | POA: Diagnosis not present

## 2023-09-23 DIAGNOSIS — Z87898 Personal history of other specified conditions: Secondary | ICD-10-CM | POA: Diagnosis not present

## 2023-09-23 DIAGNOSIS — G5603 Carpal tunnel syndrome, bilateral upper limbs: Secondary | ICD-10-CM | POA: Diagnosis not present

## 2023-09-23 DIAGNOSIS — D509 Iron deficiency anemia, unspecified: Secondary | ICD-10-CM | POA: Diagnosis not present

## 2023-09-27 ENCOUNTER — Ambulatory Visit: Payer: BC Managed Care – PPO

## 2023-09-27 DIAGNOSIS — M6281 Muscle weakness (generalized): Secondary | ICD-10-CM

## 2023-09-27 DIAGNOSIS — R202 Paresthesia of skin: Secondary | ICD-10-CM

## 2023-09-27 DIAGNOSIS — G5601 Carpal tunnel syndrome, right upper limb: Secondary | ICD-10-CM

## 2023-09-27 NOTE — Therapy (Signed)
Pt arrived to therapy this morning to review her visit with Dr. Sherryll Burger last week, which was follow up for her carpal tunnel symptoms.  Plan is for cortisone injections through neurology office to manage carpal tunnel symptoms.  Pt is awaiting scheduling for injections.  Will hold OT until injections are completed and restart therapy once pt is finding better symptom relief in order to target strengthening in bilat hands.  Pt in agreement with plan and pt will reach out to OT when ready to restart.   Danelle Earthly, MS, OTR/L

## 2023-09-29 ENCOUNTER — Ambulatory Visit: Payer: BC Managed Care – PPO

## 2023-10-04 ENCOUNTER — Ambulatory Visit: Payer: BC Managed Care – PPO

## 2023-10-04 DIAGNOSIS — G5603 Carpal tunnel syndrome, bilateral upper limbs: Secondary | ICD-10-CM | POA: Diagnosis not present

## 2023-10-06 ENCOUNTER — Ambulatory Visit: Payer: BC Managed Care – PPO

## 2023-10-11 ENCOUNTER — Ambulatory Visit: Payer: BC Managed Care – PPO

## 2023-10-13 ENCOUNTER — Ambulatory Visit: Payer: BC Managed Care – PPO

## 2023-10-14 NOTE — Progress Notes (Signed)
Name: Patricia Ramirez   MRN: [id number]    DOB: 06/19/1996   Date:10/15/2023       Progress Note  Subjective  Chief Complaint  Chief Complaint  Patient presents with   Annual Exam    HPI  Patient presents for annual CPE.  Diet: well balanced diet Exercise: lots of walking, stationary bike  Sleep: 7 hours Last dental exam:due in February Last eye exam: 2 months ago Health Maintenance  Topic Date Due   Pneumococcal Vaccination (1 of 2 - PCV) Never done   Eye exam for diabetics  Never done   HIV Screening  Never done   Hepatitis C Screening  Never done   COVID-19 Vaccine (4 - 2024-25 season) 10/31/2023*   Hemoglobin A1C  11/19/2023   Yearly kidney health urinalysis for diabetes  12/26/2023   Yearly kidney function blood test for diabetes  01/05/2024   Complete foot exam   10/14/2024   Pap Smear  11/27/2024   DTaP/Tdap/Td vaccine (2 - Td or Tdap) 04/14/2033   Flu Shot  Completed   HPV Vaccine  Aged Out  *Topic was postponed. The date shown is not the original due date.    Flowsheet Row Office Visit from 10/15/2023 in [facility]  AUDIT-C Score 2      Depression: Phq 9 is  positive, feels like it is associated with her chronic illness    10/15/2023    3:43 PM 09/22/2023    8:35 AM 04/15/2023    9:35 AM 11/27/2021    8:15 AM  Depression screen PHQ 2/9  Decreased Interest 1 1 0 0  Down, Depressed, Hopeless 1 1 0   PHQ - 2 Score 2 2 0 0  Altered sleeping 3 1    Tired, decreased energy 3 1    Change in appetite 0 0    Feeling bad or failure about yourself  0 0    Trouble concentrating 1 0    Moving slowly or fidgety/restless 1 0    Suicidal thoughts 0 0    PHQ-9 Score 10 4    Difficult doing work/chores Somewhat difficult Not difficult at all     Hypertension: BP Readings from Last 3 Encounters:  10/15/23 116/72  09/22/23 118/72  08/18/23 (!) 120/90   Obesity: Wt Readings from Last 3 Encounters:  10/15/23 175 lb 1.6 oz (79.4 kg)   09/22/23 180 lb 1.6 oz (81.7 kg)  08/18/23 185 lb (83.9 kg)   BMI Readings from Last 3 Encounters:  10/15/23 29.59 kg/m  09/22/23 30.44 kg/m  08/18/23 31.26 kg/m     Vaccines:  HPV: up to at age 34 , ask insurance if age between 77-45  Shingrix: 51-64 yo and ask insurance if covered when patient above 86 yo Pneumonia:  educated and discussed with patient. Flu:  educated and discussed with patient.  Hep C Screening: due STD testing and prevention (HIV/chl/gon/syphilis): due Intimate partner violence:none Sexual History : currently sexually active Menstrual History/LMP/Abnormal Bleeding: IUD Incontinence Symptoms: none  Breast cancer:  - Last Mammogram: does not qualify - BRCA gene screening: none  Osteoporosis: Discussed high calcium and vitamin D supplementation, weight bearing exercises  Cervical cancer screening: 11/27/2021  Skin cancer: Discussed monitoring for atypical lesions  Colorectal cancer: does not qualify   Lung cancer:   Low Dose CT Chest recommended if Age 71-80 years, 20 pack-year currently smoking OR have quit w/in 15years. Patient does not qualify.   ECG: none  Advanced Care  Planning: A voluntary discussion about advance care planning including the explanation and discussion of advance directives.  Discussed health care proxy and Living will, and the patient was able to identify a health care proxy as husband, mom/dad.  Patient does not have a living will at present time. If patient does have living will, I have requested they bring this to the clinic to be scanned in to their chart.  Lipids: Lab Results  Component Value Date   CHOL 222 (H) 12/25/2022   CHOL 194 12/30/2021   Lab Results  Component Value Date   HDL 73 12/25/2022   HDL 55 12/30/2021   Lab Results  Component Value Date   LDLCALC 134 (H) 12/25/2022   LDLCALC 121 (H) 12/30/2021   Lab Results  Component Value Date   TRIG 86 12/25/2022   TRIG 99 12/30/2021   Lab Results   Component Value Date   CHOLHDL 3.0 12/25/2022   CHOLHDL 3.5 12/30/2021   No results found for: "LDLDIRECT"  Glucose: Glucose  Date Value Ref Range Status  01/05/2023 95 70 - 99 mg/dL Final  62/13/0865 784 (H) 70 - 99 mg/dL Final  69/62/9528 413 (H) 70 - 99 mg/dL Final    Patient Active Problem List   Diagnosis Date Noted   Carpal tunnel syndrome of right wrist 08/10/2023   Syncopal seizure (HCC) 08/10/2023   Hypothyroidism 04/15/2023   Hives 04/15/2023   Stage 3a chronic kidney disease (HCC) 04/15/2023   Sickle cell trait (HCC) 11/25/2021   RBC microcytosis 05/16/2021   Iron deficiency anemia 05/16/2021   IUD (intrauterine device) in place 08/28/2019   Insulin pump status 01/13/2019   Graves disease 12/14/2018   Herpes simplex infection 10/29/2017   Anemia 06/05/2014   Type 1 diabetes mellitus (HCC) 06/05/2014    Past Surgical History:  Procedure Laterality Date   COLONOSCOPY  2004   WISDOM TOOTH EXTRACTION  2019    Family History  Problem Relation Age of Onset   Hypertension Father    Breast cancer Maternal Grandmother    Glaucoma Maternal Grandfather    Prostate cancer Maternal Grandfather    Hypertension Paternal Grandfather     Social History   Socioeconomic History   Marital status: Married    Spouse name: Not on file   Number of children: Not on file   Years of education: Not on file   Highest education level: Not on file  Occupational History   Not on file  Tobacco Use   Smoking status: Never   Smokeless tobacco: Never  Vaping Use   Vaping status: Never Used  Substance and Sexual Activity   Alcohol use: Yes    Comment: occasionaly   Drug use: Never   Sexual activity: Yes    Birth control/protection: I.U.D.  Other Topics Concern   Not on file  Social History Narrative   ** Merged History Encounter **       Social Drivers of Health   Financial Resource Strain: Medium Risk (10/15/2023)   Overall Financial Resource Strain (CARDIA)     Difficulty of Paying Living Expenses: Somewhat hard  Food Insecurity: Food Insecurity Present (10/15/2023)   Hunger Vital Sign    Worried About Running Out of Food in the Last Year: Sometimes true    Ran Out of Food in the Last Year: Never true  Transportation Needs: No Transportation Needs (10/15/2023)   PRAPARE - Administrator, Civil Service (Medical): No    Lack of Transportation (Non-Medical):  No  Physical Activity: Sufficiently Active (10/15/2023)   Exercise Vital Sign    Days of Exercise per Week: 5 days    Minutes of Exercise per Session: 30 min  Stress: Stress Concern Present (10/15/2023)   [employer] of Occupational Health - Occupational Stress Questionnaire    Feeling of Stress : Rather much  Social Connections: Moderately Isolated (10/15/2023)   Social Connection and Isolation Panel [NHANES]    Frequency of Communication with Friends and Family: More than three times a week    Frequency of Social Gatherings with Friends and Family: Once a week    Attends Religious Services: Never    Database administrator or Organizations: No    Attends Banker Meetings: Never    Marital Status: Married  Catering manager Violence: Not At Risk (10/15/2023)   Humiliation, Afraid, Rape, and Kick questionnaire    Fear of Current or Ex-Partner: No    Emotionally Abused: No    Physically Abused: No    Sexually Abused: No     Current Outpatient Medications:    Cholecalciferol 50 MCG (2000 UT) TABS, Take by mouth daily., Disp: , Rfl:    Continuous Glucose Sensor (DEXCOM G6 SENSOR) MISC, SMARTSIG:Topical Every 10 Days, Disp: , Rfl:    famotidine (PEPCID) 20 MG tablet, Take 1 tablet (20 mg total) by mouth 2 (two) times daily., Disp: 180 tablet, Rfl: 2   gabapentin (NEURONTIN) 300 MG capsule, Take 1 capsule by mouth 3 (three) times daily., Disp: , Rfl:    glucagon 1 MG injection, as directed., Disp: , Rfl:    hydrOXYzine (ATARAX) 10 MG tablet, TAKE 1 TABLET BY  MOUTH EVERYDAY AT BEDTIME, Disp: 90 tablet, Rfl: 0   insulin aspart (NOVOLOG) 100 UNIT/ML injection, INJECT SUBCUTANEOUSLY VIA INSULIN PUMP UP TO 90 UNITS/DAY, Disp: , Rfl:    Insulin Disposable Pump (OMNIPOD 5 DEXG7G6 PODS GEN 5) MISC, SMARTSIG:1 Each SUB-Q Every Other Day, Disp: , Rfl:    Insulin Disposable Pump (OMNIPOD 5 G6 INTRO, GEN 5,) KIT, Inject into the skin., Disp: , Rfl:    levothyroxine (SYNTHROID) 88 MCG tablet, Take 88 mcg by mouth daily before breakfast., Disp: , Rfl:    MOUNJARO 5 MG/0.5ML Pen, Inject 5 mg into the skin once a week., Disp: , Rfl:    ondansetron (ZOFRAN) 8 MG tablet, Take 1 tablet (8 mg total) by mouth every 8 (eight) hours as needed for nausea or vomiting., Disp: 20 tablet, Rfl: 0   QULIPTA 60 MG TABS, Take 60 mg by mouth daily., Disp: , Rfl:    SUMAtriptan (IMITREX) 50 MG tablet, as needed., Disp: , Rfl:    valACYclovir (VALTREX) 500 MG tablet, TAKE 1 TABLET BY MOUTH DAILY. CAN INCREASE TO TWICE A DAY FOR 5 DAYS IN THE EVENT OF A RECURRENCE, Disp: 90 tablet, Rfl: 3  Allergies  Allergen Reactions   Tuberculin Ppd Itching and Rash    2013/2014   Tuberculin Tests Rash    2013/2014   Nitrofurantoin Hives   Naproxen Itching and Rash   Naproxen Sodium Rash   Other Rash    TB SKIN TEST  TB SKIN TEST  TB SKIN TEST   Tuberculin Ppd Itching     ROS  Constitutional: Negative for fever or weight change.  Respiratory: Negative for cough and shortness of breath.   Cardiovascular: Negative for chest pain or palpitations.  Gastrointestinal: Negative for abdominal pain, no bowel changes.  Musculoskeletal: Negative for gait problem or joint swelling.  Skin: Negative for rash.  Neurological: Negative for dizziness or headache.  No other specific complaints in a complete review of systems (except as listed in HPI above).   Objective  Vitals:   10/15/23 1533  BP: 116/72  Pulse: 82  Resp: 16  Temp: 98 F (36.7 C)  TempSrc: Oral  SpO2: 98%  Weight: 175  lb 1.6 oz (79.4 kg)  Height: 5' 4.5" (1.638 m)    Body mass index is 29.59 kg/m.  Physical Exam Constitutional: Patient appears well-developed and well-nourished. No distress.  HENT: Head: Normocephalic and atraumatic. Ears: B TMs ok, no erythema or effusion; Nose: Nose normal. Mouth/Throat: Oropharynx is clear and moist. No oropharyngeal exudate.  Eyes: Conjunctivae and EOM are normal. Pupils are equal, round, and reactive to light. No scleral icterus.  Neck: Normal range of motion. Neck supple. No JVD present. No thyromegaly present.  Cardiovascular: Normal rate, regular rhythm and normal heart sounds.  No murmur heard. No BLE edema. Pulmonary/Chest: Effort normal and breath sounds normal. No respiratory distress. Abdominal: Soft. Bowel sounds are normal, no distension. There is no tenderness. no masses Breast: no lumps or masses, no nipple discharge or rashes Musculoskeletal: Normal range of motion, no joint effusions. No gross deformities Neurological: he is alert and oriented to person, place, and time. No cranial nerve deficit. Coordination, balance, strength, speech and gait are normal.  Skin: Skin is warm and dry. No rash noted. No erythema.  Psychiatric: Patient has a normal mood and affect. behavior is normal. Judgment and thought content normal.   No results found for this or any previous visit (from the past 2160 hours).  Diabetic Foot Exam: Diabetic Foot Exam - Simple   Simple Foot Form Diabetic Foot exam was performed with the following findings: Yes 10/15/2023  3:43 PM  Visual Inspection No deformities, no ulcerations, no other skin breakdown bilaterally: Yes Sensation Testing Intact to touch and monofilament testing bilaterally: Yes Pulse Check Posterior Tibialis and Dorsalis pulse intact bilaterally: Yes Comments      Fall Risk:    10/15/2023    3:53 PM 09/22/2023    8:34 AM 04/15/2023    9:35 AM 11/27/2021    8:15 AM  Fall Risk   Falls in the past year? 1  1 0 0  Number falls in past yr: 1 1 0 0  Injury with Fall? 1 1 0 0  Risk for fall due to : History of fall(s) History of fall(s)    Follow up Falls prevention discussed;Education provided;Falls evaluation completed Falls prevention discussed       Functional Status Survey: Is the patient deaf or have difficulty hearing?: No Does the patient have difficulty seeing, even when wearing glasses/contacts?: No Does the patient have difficulty concentrating, remembering, or making decisions?: No Does the patient have difficulty walking or climbing stairs?: No Does the patient have difficulty dressing or bathing?: No Does the patient have difficulty doing errands alone such as visiting a doctor's office or shopping?: No   Assessment & Plan  1. Annual physical exam (Primary) Recently had labs with specialist Discussed positive PHQ9- will monitor and hold off on medication at this time Has follow up with GYN regarding IUD in February.   -USPSTF grade A and B recommendations reviewed with patient; age-appropriate recommendations, preventive care, screening tests, etc discussed and encouraged; healthy living encouraged; see AVS for patient education given to patient -Discussed importance of 150 minutes of physical activity weekly, eat two servings of fish weekly, eat one  serving of tree nuts ( cashews, pistachios, pecans, almonds.Marland Kitchen) every other day, eat 6 servings of fruit/vegetables daily and drink plenty of water and avoid sweet beverages.   -Reviewed Health Maintenance: yes

## 2023-10-15 ENCOUNTER — Ambulatory Visit (INDEPENDENT_AMBULATORY_CARE_PROVIDER_SITE_OTHER): Payer: BC Managed Care – PPO | Admitting: Nurse Practitioner

## 2023-10-15 ENCOUNTER — Encounter: Payer: Self-pay | Admitting: Nurse Practitioner

## 2023-10-15 VITALS — BP 116/72 | HR 82 | Temp 98.0°F | Resp 16 | Ht 64.5 in | Wt 175.1 lb

## 2023-10-15 DIAGNOSIS — Z0000 Encounter for general adult medical examination without abnormal findings: Secondary | ICD-10-CM

## 2023-10-18 ENCOUNTER — Ambulatory Visit: Payer: BC Managed Care – PPO | Attending: Student

## 2023-10-18 DIAGNOSIS — G5601 Carpal tunnel syndrome, right upper limb: Secondary | ICD-10-CM | POA: Insufficient documentation

## 2023-10-18 DIAGNOSIS — R202 Paresthesia of skin: Secondary | ICD-10-CM | POA: Insufficient documentation

## 2023-10-18 DIAGNOSIS — R278 Other lack of coordination: Secondary | ICD-10-CM | POA: Insufficient documentation

## 2023-10-18 DIAGNOSIS — M6281 Muscle weakness (generalized): Secondary | ICD-10-CM | POA: Insufficient documentation

## 2023-10-18 NOTE — Therapy (Signed)
OUTPATIENT OCCUPATIONAL THERAPY ORTHO TREATMENT NOTE  Patient Name: Bonnielee Ryon MRN: [id number] DOB:July 07, 1996, 27 y.o., female Today's Date: 10/18/2023  PCP: Dr. Della Goo REFERRING PROVIDER: Janice Coffin, PA (with Dr. Sherryll Burger)  END OF SESSION:  OT End of Session - 10/18/23 0805     Visit Number 6    Number of Visits 12    Date for OT Re-Evaluation 11/10/23    Progress Note Due on Visit 10    OT Start Time 0800    OT Stop Time 0845    OT Time Calculation (min) 45 min    Activity Tolerance Patient tolerated treatment well    Behavior During Therapy [facility] for tasks assessed/performed            Past Medical History:  Diagnosis Date   Anemia    Graves' disease in remission    Type 1 diabetes (HCC)    Past Surgical History:  Procedure Laterality Date   COLONOSCOPY  2004   WISDOM TOOTH EXTRACTION  2019   Patient Active Problem List   Diagnosis Date Noted   Carpal tunnel syndrome of right wrist 08/10/2023   Syncopal seizure (HCC) 08/10/2023   Hypothyroidism 04/15/2023   Hives 04/15/2023   Stage 3a chronic kidney disease (HCC) 04/15/2023   Sickle cell trait (HCC) 11/25/2021   RBC microcytosis 05/16/2021   Iron deficiency anemia 05/16/2021   IUD (intrauterine device) in place 08/28/2019   Insulin pump status 01/13/2019   Graves disease 12/14/2018   Herpes simplex infection 10/29/2017   Anemia 06/05/2014   Type 1 diabetes mellitus (HCC) 06/05/2014   ONSET DATE: Several years ago, worsening over the last year  REFERRING DIAG: G56.01 (ICD-10-CM) - Carpal tunnel syndrome, right upper limb   THERAPY DIAG:  Muscle weakness (generalized)  Other lack of coordination  Paresthesias in right hand  Paresthesias in left hand  Carpal tunnel syndrome, right upper limb  Rationale for Evaluation and Treatment: Rehabilitation  SUBJECTIVE:  SUBJECTIVE STATEMENT: Pt returns today post injections for R carpal tunnel and a dose increase for gabapentin.  Pt reports her R  hand is feeling good today.   Pt accompanied by: self  PERTINENT HISTORY:  Per medical record from Dr. Sherryll Burger on 01/19/23: Patient is a 27 year-old female who presents with bilateral hand numbness and tinglig. She has the same tingling in her head. Does not have neck pain or stiffness. 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 Past medical history is significant for diabetes and thyroid problems.   NCS completed: Impression: This is an abnormal electrodiagnostic exam consistent with 1) Right minimal (grade I) carpal tunnel syndrome (median nerve entrapment at wrist). 2) No electrodiagnostic evidence of generalized peripheral neuropathy.   PRECAUTIONS: Other: Pt provided with initial education on cumulative trauma prevention/joint protection  RED FLAGS: None   WEIGHT BEARING RESTRICTIONS: No  PAIN: 10/18/23: 0/10 pain in carpal tunnel region of R hand, mild pain reported in thenar eminence of both hands. Are you having pain? Yes: NPRS scale: 2-3 at rest, up to 8 /10 Pain location: bilat hands/wrists Pain description: pins/needles, tingling, persistent dull ache, can be sharp Aggravating factors: prolonged use of hands for [place] tasks Relieving factors: Gabapentin helps the nerve pain, ice, soft brace   LIVING ENVIRONMENT: Lives with: lives with their spouse  PLOF: Independent, works full time as an Environmental health practitioner at [employer].  Leisure: enjoys crocheting and cross stitching  PATIENT GOALS: Decrease pain, improve symptoms  NEXT MD VISIT: Return in about 1 month  to Dr. Sherryll Burger  OBJECTIVE:  Note: Objective measures were completed at Evaluation unless otherwise noted.  HAND DOMINANCE: Right  ADLs: Overall ADLs: Pain in bilat hands, often compensating with L non-dominant hand to perform tasks Transfers/ambulation related to ADLs: indep Eating: pain with wrist movement bringing utensils to mouth; pain when pinching to stabilize a knife Grooming: can be painful to brush  teeth and hair, difficulty holding hair dryer UB Dressing: difficulty with zippers at times LB Dressing: difficulty with zippers at times Toileting: occasional compensation with non-dominant hand to perform peri care Bathing: often compensates with non-dominant hand to wash Equipment: none  FUNCTIONAL OUTCOME MEASURES: FOTO: 48 ; predicted 60  UPPER EXTREMITY ROM:     Active ROM Right eval Left eval  Shoulder flexion    Shoulder abduction    Shoulder adduction    Shoulder extension    Shoulder internal rotation    Shoulder external rotation    Elbow flexion    Elbow extension 62 70  Wrist flexion 86 90  Wrist extension    Wrist ulnar deviation    Wrist radial deviation    Wrist pronation 90 90  Wrist supination 80 90  (Blank rows = not tested)    *Able to make full composite fist, achieve full digit extension, and oppose all digits to thumb in bilat hands  UPPER EXTREMITY MMT:     MMT Right eval Left eval  Shoulder flexion    Shoulder abduction    Shoulder adduction    Shoulder extension    Shoulder internal rotation    Shoulder external rotation    Middle trapezius    Lower trapezius    Elbow flexion    Elbow extension    Wrist flexion 4- 4  Wrist extension 4- 4  Wrist ulnar deviation    Wrist radial deviation    Wrist pronation    Wrist supination    (Blank rows = not tested)  HAND FUNCTION: Grip strength: Right: 9 lbs; Left: 40 lbs, Lateral pinch: Right: 10 lbs, Left: 12 lbs, and 3 point pinch: Right: 6 lbs, Left: 10 lbs 10/18/23: R grip 25 lbs, L 34 lbs; Lateral pinch: Right 12 lbs, Left: 13 lbs, and 3 point pinch: Right: 9 lbs, Left: 10 lbs   COORDINATION: 9 Hole Peg test: Right: 24 sec; Left: 25 sec  SENSATION: Impaired bilat hands; pt reports diffuse numbness in hands, all digits.  "Hands stay numb for most of the time."  EDEMA: No visible edema  COGNITION: Overall cognitive status: Within functional limits for tasks  assessed  OBSERVATIONS:  Pt pleasant, cooperative, receptive to tx plan and education provided.  TODAY'S TREATMENT:                                                                                                                              DATE: 09/13/23 Paraffin:  X10 min to R/L hands for pain management/muscle relaxation.  Therapeutic Exercise: -Facilitated hand  strengthening with use of hand gripper set at 6.6# on the R and 11.2# on the L hand and reduced to 6.6# for R/L for next 2 trials to remove jumbo pegs from pegboard x3 trials total using R/L hands. Instructed in self passive wrist ext stretching and distal median nerve stretch bilaterally between each trial. -Reviewed HEP with plans to restart light resistance dumbbell exercises for R wrist strengthening and continue with wrist stretches.  Encouraged pt to continue to take a break from theraputty exercises and to ice after work as needed.  Therapeutic Activity: -Facilitated R forearm, wrist, and hand strengthening with participation in EZ board tools.  Pt worked with long handled tool to facilitate R wrist flex/ext and forearm pron/sup against wide strip of velcro, small and large base key turn, and large dial turn against narrow strip of velcro x3 reps for each tool (up/down board=1 rep).  Rest breaks between each tool.    PATIENT EDUCATION: Education details: HEP review Person educated: Patient Education method: Explanation Education comprehension: verbalized understanding and needs further education  HOME EXERCISE PROGRAM: Pink theraputty, median nerve glides, 1# dumbbell for wrist strengthening, contrast bath/ice   GOALS: Goals reviewed with patient? Yes  SHORT TERM GOALS: Target date: 09/01/23  Pt will be indep to perform HEP for improving bilat wrist and hand strength and flexibility.  Baseline: Eval: HEP not yet initiated Goal status: INITIAL  2.  Pt will be indep to verbalize 2-3 joint protection/cumulative  trauma prevention strategies to reduce pain/paresthesias in the R/L hands.   Baseline: Eval: Initiated educ at eval; additional training needed Goal status: INITIAL  LONG TERM GOALS: Target date: 11/10/2023  Pt will increase FOTO score to 60 or better to indicate improvement in self perceived functional use of the R hand with daily tasks. Baseline: Eval: 48 Goal status: INITIAL  2.  Pt will tolerate manual therapy, therapeutic modalities, and exercises to decrease pain in R hand  to a reported 3/10 pain or less with activity.   Baseline: Eval: R hand 2-3/10 at rest, up to 8/10 pain with activity Goal status: INITIAL  3.  Pt will increase R grip strength by 10 or more lbs in order to improve ability to hold and carry ADL supplies.    Baseline: Eval: R grip 9 lbs (dominant), L 40 lbs (non-dominant) Goal status: INITIAL  4.  Pt will be indep with donning/doffing and wearing schedule with carpal tunnel brace to manage pain/paresthesias in R hand. Baseline: Eval: Wearing a soft brace at night time (R hand); will likely need standard carpal tunnel brace (OT encouraged pt bring in current brace to next session for OT to assess appropriateness of brace) Goal status: INITIAL  ASSESSMENT:  CLINICAL IMPRESSION: Pt returns today post injections for R carpal tunnel and a dose increase for gabapentin.  Pt reports her R hand is feeling good today around the carpal tunnel region.  Pt stated that since her shots, she has not been experiencing pain at the carpal tunnel, but mild pain in the thenar eminences of both hands.  Managed pain today with paraffin, frequent rest breaks, alternating hands with exercises, and stretching.  Remeasured grip and pinch measures to have new baseline measures since pt's break from therapy.  Grip and pinch strength measures have improved given pt's pain reduction.  Will continue to increase resistance exercises gradually and as tolerated.  Pt will continue to benefit from skilled  OT to provide reinforcement on cumulative trauma prevention, joint protection strategies, ADL  and work activity modification, AE, splinting education, pain management strategies, and therapeutic exercises in order to reduce pain/paresthesias, increase strength, and promote increased functional use of the R dominant hand for ADL and work related tasks.  Pt in agreement with poc.  PERFORMANCE DEFICITS: in functional skills including ADLs, IADLs, sensation, edema, ROM, strength, pain, flexibility, Fine motor control, body mechanics, decreased knowledge of precautions, decreased knowledge of use of DME, and UE functional use, and psychosocial skills including coping strategies, environmental adaptation, habits, and routines and behaviors.   IMPAIRMENTS: are limiting patient from ADLs, IADLs, rest and sleep, work, and leisure.   COMORBIDITIES: may have co-morbidities  that affects occupational performance. Patient will benefit from skilled OT to address above impairments and improve overall function.  MODIFICATION OR ASSISTANCE TO COMPLETE EVALUATION: No modification of tasks or assist necessary to complete an evaluation.  OT OCCUPATIONAL PROFILE AND HISTORY: Problem focused assessment: Including review of records relating to presenting problem.  CLINICAL DECISION MAKING: Moderate - several treatment options, min-mod task modification necessary  REHAB POTENTIAL: Good  EVALUATION COMPLEXITY: Low      PLAN:  OT FREQUENCY: 1x/week  OT DURATION: up to 12 weeks  PLANNED INTERVENTIONS: 97168 OT Re-evaluation, 97535 self care/ADL training, 82956 therapeutic exercise, 97530 therapeutic activity, 97112 neuromuscular re-education, 97140 manual therapy, 97035 ultrasound, 97018 paraffin, 21308 moist heat, 97010 cryotherapy, 97034 contrast bath, 97033 iontophoresis, 97760 Orthotics management and training, 65784 Splinting (initial encounter), [id number] Subsequent splinting/medication, passive range of motion,  psychosocial skills training, coping strategies training, patient/family education, and DME and/or AE instructions  RECOMMENDED OTHER SERVICES: None at this time  CONSULTED AND AGREED WITH PLAN OF CARE: Patient  PLAN FOR NEXT SESSION: see above  Danelle Earthly, MS, OTR/L  Otis Dials, OT 10/18/2023, 9:35 AM

## 2023-10-20 ENCOUNTER — Ambulatory Visit: Payer: BC Managed Care – PPO

## 2023-10-22 ENCOUNTER — Other Ambulatory Visit: Payer: Self-pay | Admitting: Nurse Practitioner

## 2023-10-22 DIAGNOSIS — L509 Urticaria, unspecified: Secondary | ICD-10-CM

## 2023-10-25 ENCOUNTER — Ambulatory Visit: Payer: BC Managed Care – PPO

## 2023-10-25 DIAGNOSIS — G5601 Carpal tunnel syndrome, right upper limb: Secondary | ICD-10-CM | POA: Diagnosis not present

## 2023-10-25 DIAGNOSIS — M6281 Muscle weakness (generalized): Secondary | ICD-10-CM | POA: Diagnosis not present

## 2023-10-25 DIAGNOSIS — R202 Paresthesia of skin: Secondary | ICD-10-CM

## 2023-10-25 DIAGNOSIS — R278 Other lack of coordination: Secondary | ICD-10-CM | POA: Diagnosis not present

## 2023-10-25 NOTE — Therapy (Signed)
OUTPATIENT OCCUPATIONAL THERAPY ORTHO TREATMENT NOTE  Patient Name: Patricia Ramirez MRN: [id number] DOB:30-Sep-1996, 27 y.o., female Today's Date: 10/25/2023  PCP: Dr. Della Goo REFERRING PROVIDER: Janice Coffin, PA (with Dr. Sherryll Burger)  END OF SESSION:  OT End of Session - 10/25/23 0808     Visit Number 7    Number of Visits 12    Date for OT Re-Evaluation 11/10/23    Progress Note Due on Visit 10    OT Start Time 0755    OT Stop Time 0840    OT Time Calculation (min) 45 min    Activity Tolerance Patient tolerated treatment well    Behavior During Therapy [facility] for tasks assessed/performed            Past Medical History:  Diagnosis Date   Anemia    Graves' disease in remission    Type 1 diabetes (HCC)    Past Surgical History:  Procedure Laterality Date   COLONOSCOPY  2004   WISDOM TOOTH EXTRACTION  2019   Patient Active Problem List   Diagnosis Date Noted   Carpal tunnel syndrome of right wrist 08/10/2023   Syncopal seizure (HCC) 08/10/2023   Hypothyroidism 04/15/2023   Hives 04/15/2023   Stage 3a chronic kidney disease (HCC) 04/15/2023   Sickle cell trait (HCC) 11/25/2021   RBC microcytosis 05/16/2021   Iron deficiency anemia 05/16/2021   IUD (intrauterine device) in place 08/28/2019   Insulin pump status 01/13/2019   Graves disease 12/14/2018   Herpes simplex infection 10/29/2017   Anemia 06/05/2014   Type 1 diabetes mellitus (HCC) 06/05/2014   ONSET DATE: Several years ago, worsening over the last year  REFERRING DIAG: G56.01 (ICD-10-CM) - Carpal tunnel syndrome, right upper limb   THERAPY DIAG:  Muscle weakness (generalized)  Paresthesias in right hand  Paresthesias in left hand  Carpal tunnel syndrome, right upper limb  Rationale for Evaluation and Treatment: Rehabilitation  SUBJECTIVE:  SUBJECTIVE STATEMENT: Pt reports having a very large copay now with therapy visits.  OT discussed plan to d/c next session with plan for pt to continue  strengthening at home with a thorough HEP.  Pt in agreement with this plan.  Pt accompanied by: self  PERTINENT HISTORY:  Per medical record from Dr. Sherryll Burger on 01/19/23: Patient is a 27 year-old female who presents with bilateral hand numbness and tinglig. She has the same tingling in her head. Does not have neck pain or stiffness. 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 Past medical history is significant for diabetes and thyroid problems.   NCS completed: Impression: This is an abnormal electrodiagnostic exam consistent with 1) Right minimal (grade I) carpal tunnel syndrome (median nerve entrapment at wrist). 2) No electrodiagnostic evidence of generalized peripheral neuropathy.   PRECAUTIONS: Other: Pt provided with initial education on cumulative trauma prevention/joint protection  RED FLAGS: None   WEIGHT BEARING RESTRICTIONS: No  PAIN: 10/25/23: 0/10 pain in carpal tunnel region of R hand, 3/10 pain with activity. Are you having pain? Yes: NPRS scale: 2-3 at rest, up to 8 /10 Pain location: bilat hands/wrists Pain description: pins/needles, tingling, persistent dull ache, can be sharp Aggravating factors: prolonged use of hands for [facility] tasks Relieving factors: Gabapentin helps the nerve pain, ice, soft brace   LIVING ENVIRONMENT: Lives with: lives with their spouse  PLOF: Independent, works full time as an Environmental health practitioner at [employer].  Leisure: enjoys crocheting and cross stitching  PATIENT GOALS: Decrease pain, improve symptoms  NEXT MD VISIT: Return in  about 1 month to Dr. Sherryll Burger  OBJECTIVE:  Note: Objective measures were completed at Evaluation unless otherwise noted.  HAND DOMINANCE: Right  ADLs: Overall ADLs: Pain in bilat hands, often compensating with L non-dominant hand to perform tasks Transfers/ambulation related to ADLs: indep Eating: pain with wrist movement bringing utensils to mouth; pain when pinching to stabilize a knife Grooming: can be  painful to brush teeth and hair, difficulty holding hair dryer UB Dressing: difficulty with zippers at times LB Dressing: difficulty with zippers at times Toileting: occasional compensation with non-dominant hand to perform peri care Bathing: often compensates with non-dominant hand to wash Equipment: none  FUNCTIONAL OUTCOME MEASURES: FOTO: 48 ; predicted 60  UPPER EXTREMITY ROM:     Active ROM Right eval Left eval  Shoulder flexion    Shoulder abduction    Shoulder adduction    Shoulder extension    Shoulder internal rotation    Shoulder external rotation    Elbow flexion    Elbow extension 62 70  Wrist flexion 86 90  Wrist extension    Wrist ulnar deviation    Wrist radial deviation    Wrist pronation 90 90  Wrist supination 80 90  (Blank rows = not tested)    *Able to make full composite fist, achieve full digit extension, and oppose all digits to thumb in bilat hands  UPPER EXTREMITY MMT:     MMT Right eval Left eval  Shoulder flexion    Shoulder abduction    Shoulder adduction    Shoulder extension    Shoulder internal rotation    Shoulder external rotation    Middle trapezius    Lower trapezius    Elbow flexion    Elbow extension    Wrist flexion 4- 4  Wrist extension 4- 4  Wrist ulnar deviation    Wrist radial deviation    Wrist pronation    Wrist supination    (Blank rows = not tested)  HAND FUNCTION: Grip strength: Right: 9 lbs; Left: 40 lbs, Lateral pinch: Right: 10 lbs, Left: 12 lbs, and 3 point pinch: Right: 6 lbs, Left: 10 lbs 10/18/23: R grip 25 lbs, L 34 lbs; Lateral pinch: Right 12 lbs, Left: 13 lbs, and 3 point pinch: Right: 9 lbs, Left: 10 lbs   COORDINATION: 9 Hole Peg test: Right: 24 sec; Left: 25 sec  SENSATION: Impaired bilat hands; pt reports diffuse numbness in hands, all digits.  "Hands stay numb for most of the time."  EDEMA: No visible edema  COGNITION: Overall cognitive status: Within functional limits for tasks  assessed  OBSERVATIONS:  Pt pleasant, cooperative, receptive to tx plan and education provided.  TODAY'S TREATMENT:                                                                                                                              DATE: 09/25/23 Paraffin:  X10 min to R/L hands for pain management/muscle relaxation.  Therapeutic  Exercise: -Instructed pt in median nerve glides for BUEs; x2 reps on the L, x5 reps on the R, mod vc for form and technique. -Facilitated hand strengthening with use of hand gripper set at min resistance with 1 red band to complete 2 sets 10 reps for R hand.  Instructed in self passive wrist ext stretching and distal median nerve stretch bilaterally between each trial. -Facilitated pinch strengthening with use of therapy resistant clothespins to target lateral and 3 point pinch of R/L hands. Able to manage all colors of resistance for 1 trial on each hand for each pinch type.   -Completed isometric hold for all wrist planes for 2 sets of 30 sec holds with a 2# dumbbell; Min vc and tactile cues to ensure neutral wrist in each position with R arm hanging off incline wedge.    PATIENT EDUCATION: Education details: poc Person educated: Patient Education method: Explanation Education comprehension: verbalized understanding and needs further education  HOME EXERCISE PROGRAM: Pink theraputty, median nerve glides, 1# dumbbell for wrist strengthening, contrast bath/ice   GOALS: Goals reviewed with patient? Yes  SHORT TERM GOALS: Target date: 09/01/23  Pt will be indep to perform HEP for improving bilat wrist and hand strength and flexibility.  Baseline: Eval: HEP not yet initiated Goal status: INITIAL  2.  Pt will be indep to verbalize 2-3 joint protection/cumulative trauma prevention strategies to reduce pain/paresthesias in the R/L hands.   Baseline: Eval: Initiated educ at eval; additional training needed Goal status: INITIAL  LONG TERM GOALS: Target  date: 11/10/2023  Pt will increase FOTO score to 60 or better to indicate improvement in self perceived functional use of the R hand with daily tasks. Baseline: Eval: 48 Goal status: INITIAL  2.  Pt will tolerate manual therapy, therapeutic modalities, and exercises to decrease pain in R hand  to a reported 3/10 pain or less with activity.   Baseline: Eval: R hand 2-3/10 at rest, up to 8/10 pain with activity Goal status: INITIAL  3.  Pt will increase R grip strength by 10 or more lbs in order to improve ability to hold and carry ADL supplies.    Baseline: Eval: R grip 9 lbs (dominant), L 40 lbs (non-dominant) Goal status: INITIAL  4.  Pt will be indep with donning/doffing and wearing schedule with carpal tunnel brace to manage pain/paresthesias in R hand. Baseline: Eval: Wearing a soft brace at night time (R hand); will likely need standard carpal tunnel brace (OT encouraged pt bring in current brace to next session for OT to assess appropriateness of brace) Goal status: INITIAL  ASSESSMENT:  CLINICAL IMPRESSION: Pt tolerated paraffin and all therapeutic exercises well this date, allowing rest and stretching breaks, and intermittent use of moist heat between activities.  Pt reported no pain at rest today, and mild pain with activity within the last 24-48 hours, but verbalizes good cumulative trauma prevention strategies and activity modifications to avoid higher pain levels.  Pt is also now using heated mitts for pain management at home, and feels these have been effective.  Pt reports having a very large copay now with therapy visits.  OT discussed plan to d/c next session with plan for pt to continue strengthening at home with a thorough HEP.  Pt in agreement with this plan. Marland Kitchen  PERFORMANCE DEFICITS: in functional skills including ADLs, IADLs, sensation, edema, ROM, strength, pain, flexibility, Fine motor control, body mechanics, decreased knowledge of precautions, decreased knowledge of use  of DME, and UE functional  use, and psychosocial skills including coping strategies, environmental adaptation, habits, and routines and behaviors.   IMPAIRMENTS: are limiting patient from ADLs, IADLs, rest and sleep, work, and leisure.   COMORBIDITIES: may have co-morbidities  that affects occupational performance. Patient will benefit from skilled OT to address above impairments and improve overall function.  MODIFICATION OR ASSISTANCE TO COMPLETE EVALUATION: No modification of tasks or assist necessary to complete an evaluation.  OT OCCUPATIONAL PROFILE AND HISTORY: Problem focused assessment: Including review of records relating to presenting problem.  CLINICAL DECISION MAKING: Moderate - several treatment options, min-mod task modification necessary  REHAB POTENTIAL: Good  EVALUATION COMPLEXITY: Low      PLAN:  OT FREQUENCY: 1x/week  OT DURATION: up to 12 weeks  PLANNED INTERVENTIONS: 97168 OT Re-evaluation, 97535 self care/ADL training, 56213 therapeutic exercise, 97530 therapeutic activity, 97112 neuromuscular re-education, 97140 manual therapy, 97035 ultrasound, 97018 paraffin, 08657 moist heat, 97010 cryotherapy, 97034 contrast bath, 97033 iontophoresis, 97760 Orthotics management and training, 84696 Splinting (initial encounter), [id number] Subsequent splinting/medication, passive range of motion, psychosocial skills training, coping strategies training, patient/family education, and DME and/or AE instructions  RECOMMENDED OTHER SERVICES: None at this time  CONSULTED AND AGREED WITH PLAN OF CARE: Patient  PLAN FOR NEXT SESSION: see above  Danelle Earthly, MS, OTR/L  Otis Dials, OT 10/25/2023, 11:35 AM

## 2023-10-25 NOTE — Telephone Encounter (Signed)
Requested Prescriptions  Pending Prescriptions Disp Refills   hydrOXYzine (ATARAX) 10 MG tablet [Pharmacy Med Name: HYDROXYZINE HCL 10 MG TABLET] 90 tablet 0    Sig: TAKE 1 TABLET BY MOUTH EVERYDAY AT BEDTIME     Ear, Nose, and Throat:  Antihistamines 2 Failed - 10/25/2023 12:03 PM      Failed - Cr in normal range and within 360 days    Creatinine, Ser  Date Value Ref Range Status  01/05/2023 1.29 (H) 0.57 - 1.00 mg/dL Final         Passed - Valid encounter within last 12 months    Recent Outpatient Visits           1 week ago Annual physical exam   [facility] Della Goo F, FNP   1 month ago Type 1 diabetes mellitus with diabetic neuropathy [facility])   [facility] Della Goo F, FNP   6 months ago Type 1 diabetes mellitus with hyperglycemia [facility])   [facility] Berniece Salines, FNP       Future Appointments             In 11 months Zane Herald, Rudolpho Sevin, FNP [facility], [facility]             famotidine (PEPCID) 20 MG tablet [Pharmacy Med Name: FAMOTIDINE 20 MG TABLET] 180 tablet 2    Sig: TAKE 1 TABLET BY MOUTH TWICE A DAY     Gastroenterology:  H2 Antagonists Passed - 10/25/2023 12:03 PM      Passed - Valid encounter within last 12 months    Recent Outpatient Visits           1 week ago Annual physical exam   [facility] Della Goo F, FNP   1 month ago Type 1 diabetes mellitus with diabetic neuropathy [facility])    [facility] Della Goo F, FNP   6 months ago Type 1 diabetes mellitus with hyperglycemia [facility])   [facility] Berniece Salines, FNP       Future Appointments             In 11 months Zane Herald, Rudolpho Sevin, FNP [facility]

## 2023-11-02 ENCOUNTER — Ambulatory Visit: Payer: BC Managed Care – PPO

## 2023-11-02 DIAGNOSIS — M6281 Muscle weakness (generalized): Secondary | ICD-10-CM | POA: Diagnosis not present

## 2023-11-02 DIAGNOSIS — G5601 Carpal tunnel syndrome, right upper limb: Secondary | ICD-10-CM | POA: Diagnosis not present

## 2023-11-02 DIAGNOSIS — R278 Other lack of coordination: Secondary | ICD-10-CM | POA: Diagnosis not present

## 2023-11-02 DIAGNOSIS — R202 Paresthesia of skin: Secondary | ICD-10-CM

## 2023-11-02 NOTE — Therapy (Signed)
 OUTPATIENT OCCUPATIONAL THERAPY ORTHO DISCHARGE NOTE  Patient Name: Patricia Ramirez MRN: [id number] DOB:Mar 08, 1996, 27 y.o., female Today's Date: 11/02/2023  PCP: Dr. [name] Spray REFERRING PROVIDER: Kaitlin Paich, PA (with Dr. Maree)  END OF SESSION:  OT End of Session - 11/02/23 1539     Visit Number 8    Number of Visits 12    Date for OT Re-Evaluation 11/10/23    Progress Note Due on Visit 10    OT Start Time 1530    OT Stop Time 1615    OT Time Calculation (min) 45 min    Activity Tolerance Patient tolerated treatment well    Behavior During Therapy [facility] for tasks assessed/performed            Past Medical History:  Diagnosis Date   Anemia    Graves' disease in remission    Type 1 diabetes (HCC)    Past Surgical History:  Procedure Laterality Date   COLONOSCOPY  2004   WISDOM TOOTH EXTRACTION  2019   Patient Active Problem List   Diagnosis Date Noted   Carpal tunnel syndrome of right wrist 08/10/2023   Syncopal seizure (HCC) 08/10/2023   Hypothyroidism 04/15/2023   Hives 04/15/2023   Stage 3a chronic kidney disease (HCC) 04/15/2023   Sickle cell trait (HCC) 11/25/2021   RBC microcytosis 05/16/2021   Iron  deficiency anemia 05/16/2021   IUD (intrauterine device) in place 08/28/2019   Insulin  pump status 01/13/2019   Graves disease 12/14/2018   Herpes simplex infection 10/29/2017   Anemia 06/05/2014   Type 1 diabetes mellitus (HCC) 06/05/2014   ONSET DATE: Several years ago, worsening over the last year  REFERRING DIAG: G56.01 (ICD-10-CM) - Carpal tunnel syndrome, right upper limb   THERAPY DIAG:  Muscle weakness (generalized)  Paresthesias in right hand  Paresthesias in left hand  Carpal tunnel syndrome, right upper limb  Rationale for Evaluation and Treatment: Rehabilitation  SUBJECTIVE:  SUBJECTIVE STATEMENT: Pt reports that she's been able to rest her hands a lot with the holiday and having the week off of work which has helped to further  decrease the pain in her hands. Pt accompanied by: self  PERTINENT HISTORY:  Per medical record from Dr. Maree on 01/19/23: Patient is a 27 year-old female who presents with bilateral hand numbness and tinglig. She has the same tingling in her head. Does not have neck pain or stiffness. 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 Past medical history is significant for diabetes and thyroid  problems.   NCS completed: Impression: This is an abnormal electrodiagnostic exam consistent with 1) Right minimal (grade I) carpal tunnel syndrome (median nerve entrapment at wrist). 2) No electrodiagnostic evidence of generalized peripheral neuropathy.   PRECAUTIONS: Other: Pt provided with initial education on cumulative trauma prevention/joint protection  RED FLAGS: None   WEIGHT BEARING RESTRICTIONS: No  PAIN: 11/02/23: 0/10 pain in carpal tunnel region of R hand, 1-2/10 pain with activity. Are you having pain? Yes: NPRS scale: 2-3 at rest, up to 8 /10 Pain location: bilat hands/wrists Pain description: pins/needles, tingling, persistent dull ache, can be sharp Aggravating factors: prolonged use of hands for [facility] tasks Relieving factors: Gabapentin helps the nerve pain, ice, soft brace   LIVING ENVIRONMENT: Lives with: lives with their spouse  PLOF: Independent, works full time as an environmental health practitioner at [employer].  Leisure: enjoys crocheting and cross stitching  PATIENT GOALS: Decrease pain, improve symptoms  NEXT MD VISIT: Return in about 1 month to Dr. Maree  OBJECTIVE:  Note: Objective measures were completed at Evaluation unless otherwise noted.  HAND DOMINANCE: Right  ADLs: Overall ADLs: Pain in bilat hands, often compensating with L non-dominant hand to perform tasks Transfers/ambulation related to ADLs: indep Eating: pain with wrist movement bringing utensils to mouth; pain when pinching to stabilize a knife Grooming: can be painful to brush teeth and hair, difficulty  holding hair dryer UB Dressing: difficulty with zippers at times LB Dressing: difficulty with zippers at times Toileting: occasional compensation with non-dominant hand to perform peri care Bathing: often compensates with non-dominant hand to wash Equipment: none  FUNCTIONAL OUTCOME MEASURES: FOTO: 48 ; predicted 60 11/02/23: 60  UPPER EXTREMITY ROM:     Active ROM Right eval Right 11/02/23 Left eval Left 11/02/23  Shoulder flexion      Shoulder abduction      Shoulder adduction      Shoulder extension      Shoulder internal rotation      Shoulder external rotation      Elbow flexion      Elbow extension      Wrist flexion 86 90 90 90  Wrist extension 68 70 70 75  Wrist ulnar deviation      Wrist radial deviation      Wrist pronation 90 90 90 90  Wrist supination 80 85 90 90  (Blank rows = not tested)    *Able to make full composite fist, achieve full digit extension, and oppose all digits to thumb in bilat hands  UPPER EXTREMITY MMT:     MMT Right eval Right 11/02/23 Left eval Left 11/02/23  Shoulder flexion      Shoulder abduction      Shoulder adduction      Shoulder extension      Shoulder internal rotation      Shoulder external rotation      Middle trapezius      Lower trapezius      Elbow flexion      Elbow extension      Wrist flexion 4- 4+ 4 5  Wrist extension 4- 4 4 5   Wrist ulnar deviation      Wrist radial deviation      Wrist pronation      Wrist supination      (Blank rows = not tested)  HAND FUNCTION: Grip strength: Right: 9 lbs; Left: 40 lbs, Lateral pinch: Right: 10 lbs, Left: 12 lbs, and 3 point pinch: Right: 6 lbs, Left: 10 lbs 10/18/23: R grip 25 lbs, L 34 lbs; Lateral pinch: Right 12 lbs, Left: 13 lbs, and 3 point pinch: Right: 9 lbs, Left: 10 lbs  11/02/23: R grip 37 lbs, L 47 lbs; Lateral pinch: Right 12 lbs, Left: 10 lbs, and 3 point pinch: Right: 13 lbs, Left: 14 lbs   COORDINATION: 9 Hole Peg test: Right: 24 sec; Left: 25  sec  SENSATION: Impaired bilat hands; pt reports diffuse numbness in hands, all digits.  Hands stay numb for most of the time.  EDEMA: No visible edema  COGNITION: Overall cognitive status: Within functional limits for tasks assessed  OBSERVATIONS:  Pt pleasant, cooperative, receptive to tx plan and education provided.  TODAY'S TREATMENT:  DATE: 11/02/23 Paraffin:  X10 min to R/L hands for pain management/muscle relaxation.  Therapeutic Activity: Objective measures taken and goals updated and reviewed for discharge assessment.  Reviewed indications for following back up with MD, pain management strategies, body mechanics, and HEP progression.  Pt verbalized understanding of all education provided.  PATIENT EDUCATION: Education details: d/c recommendations, condition management Person educated: Patient Education method: Explanation Education comprehension: verbalized understanding  HOME EXERCISE PROGRAM: Pink theraputty, median nerve glides, 1# dumbbell for wrist strengthening, contrast bath/ice   GOALS: Goals reviewed with patient? Yes  SHORT TERM GOALS: Target date: 09/01/23  Pt will be indep to perform HEP for improving bilat wrist and hand strength and flexibility.  Baseline: Eval: HEP not yet initiated; 11/02/23: indep Goal status: achieved  2.  Pt will be indep to verbalize 2-3 joint protection/cumulative trauma prevention strategies to reduce pain/paresthesias in the R/L hands.   Baseline: Eval: Initiated educ at eval; additional training needed; 11/02/23: indep Goal status: achieved  LONG TERM GOALS: Target date: 11/10/2023  Pt will increase FOTO score to 60 or better to indicate improvement in self perceived functional use of the R hand with daily tasks. Baseline: Eval: 48; 11/02/23: 60 Goal status: achieved  2.  Pt will tolerate manual  therapy, therapeutic modalities, and exercises to decrease pain in R hand to a reported 3/10 pain or less with activity.   Baseline: Eval: R hand 2-3/10 at rest, up to 8/10 pain with activity; 11/02/23: 0/10 pain at rest, 1-2 with activity Goal status: achieved   3.  Pt will increase R grip strength by 10 or more lbs in order to improve ability to hold and carry ADL supplies.    Baseline: Eval: R grip 9 lbs (dominant), L 40 lbs (non-dominant); 11/02/23: R grip 37 lbs, L grip 47 lbs Goal status: achieved  4.  Pt will be indep with donning/doffing and wearing schedule with carpal tunnel brace to manage pain/paresthesias in R hand. Baseline: Eval: Wearing a soft brace at night time (R hand); will likely need standard carpal tunnel brace (OT encouraged pt bring in current brace to next session for OT to assess appropriateness of brace); 11/02/23: Pt no longer having to wear carpal tunnel brace at night time is is waking up without paresthesias.  Pt aware of indications for wearing brace.   Goal status: achieved  ASSESSMENT:  CLINICAL IMPRESSION: Pt seen this date for OT d/c after completion of 8 OT sessions.  FOTO score has improved from 48 at eval to 60 at d/c.  Grip and pinch strength measures continue to improve, though R grip remains below normal for pt's age norm.  Pt does have theraputty and understands how to progress grip strength gradually with use of putty exercises as tolerated.  Pt has been educated on pain management strategies, joint protection, and cumulative trauma prevention strategies and shows excellent adherence to all.  Pt appropriate for d/c at this time with goals met.  Pt advised to follow up with Dr. Maree should she have a symptom flare that persists despite following pain management and cumulative trauma prevention strategies.  Pt verbalizes understanding of all education provided and is in agreement with d/c plan this date.  PERFORMANCE DEFICITS: in functional skills including  ADLs, IADLs, sensation, edema, ROM, strength, pain, flexibility, Fine motor control, body mechanics, decreased knowledge of precautions, decreased knowledge of use of DME, and UE functional use, and psychosocial skills including coping strategies, environmental adaptation, habits, and routines and behaviors.  IMPAIRMENTS: are limiting patient from ADLs, IADLs, rest and sleep, work, and leisure.   COMORBIDITIES: may have co-morbidities  that affects occupational performance. Patient will benefit from skilled OT to address above impairments and improve overall function.  MODIFICATION OR ASSISTANCE TO COMPLETE EVALUATION: No modification of tasks or assist necessary to complete an evaluation.  OT OCCUPATIONAL PROFILE AND HISTORY: Problem focused assessment: Including review of records relating to presenting problem.  CLINICAL DECISION MAKING: Moderate - several treatment options, min-mod task modification necessary  REHAB POTENTIAL: Good  EVALUATION COMPLEXITY: Low      PLAN:  OT FREQUENCY: 1x/week  OT DURATION: up to 12 weeks  PLANNED INTERVENTIONS: 97168 OT Re-evaluation, 97535 self care/ADL training, 02889 therapeutic exercise, 97530 therapeutic activity, 97112 neuromuscular re-education, 97140 manual therapy, 97035 ultrasound, 97018 paraffin, 02989 moist heat, 97010 cryotherapy, 97034 contrast bath, 97033 iontophoresis, 97760 Orthotics management and training, 02239 Splinting (initial encounter), [id number] Subsequent splinting/medication, passive range of motion, psychosocial skills training, coping strategies training, patient/family education, and DME and/or AE instructions  RECOMMENDED OTHER SERVICES: None at this time  CONSULTED AND AGREED WITH PLAN OF CARE: Patient  PLAN FOR NEXT SESSION: N/A; d/c this date  Inocente Blazing, MS, OTR/L  Inocente MARLA Blazing, OT 11/02/2023, 4:26 PM

## 2023-12-01 ENCOUNTER — Other Ambulatory Visit: Payer: Self-pay | Admitting: Obstetrics and Gynecology

## 2023-12-01 DIAGNOSIS — Z8619 Personal history of other infectious and parasitic diseases: Secondary | ICD-10-CM

## 2023-12-05 DIAGNOSIS — N179 Acute kidney failure, unspecified: Secondary | ICD-10-CM | POA: Diagnosis not present

## 2023-12-05 DIAGNOSIS — R638 Other symptoms and signs concerning food and fluid intake: Secondary | ICD-10-CM | POA: Diagnosis not present

## 2023-12-05 DIAGNOSIS — R110 Nausea: Secondary | ICD-10-CM | POA: Diagnosis not present

## 2023-12-05 DIAGNOSIS — K5900 Constipation, unspecified: Secondary | ICD-10-CM | POA: Diagnosis not present

## 2023-12-05 DIAGNOSIS — R109 Unspecified abdominal pain: Secondary | ICD-10-CM | POA: Diagnosis not present

## 2023-12-05 DIAGNOSIS — Z975 Presence of (intrauterine) contraceptive device: Secondary | ICD-10-CM | POA: Diagnosis not present

## 2023-12-05 DIAGNOSIS — E109 Type 1 diabetes mellitus without complications: Secondary | ICD-10-CM | POA: Diagnosis not present

## 2023-12-06 DIAGNOSIS — R109 Unspecified abdominal pain: Secondary | ICD-10-CM | POA: Diagnosis not present

## 2023-12-07 DIAGNOSIS — R109 Unspecified abdominal pain: Secondary | ICD-10-CM | POA: Diagnosis not present

## 2023-12-07 DIAGNOSIS — R1013 Epigastric pain: Secondary | ICD-10-CM | POA: Diagnosis not present

## 2023-12-07 DIAGNOSIS — E109 Type 1 diabetes mellitus without complications: Secondary | ICD-10-CM | POA: Diagnosis not present

## 2023-12-07 DIAGNOSIS — K5900 Constipation, unspecified: Secondary | ICD-10-CM | POA: Diagnosis not present

## 2023-12-07 DIAGNOSIS — N179 Acute kidney failure, unspecified: Secondary | ICD-10-CM | POA: Diagnosis not present

## 2023-12-10 ENCOUNTER — Other Ambulatory Visit: Payer: Self-pay

## 2023-12-10 ENCOUNTER — Encounter: Payer: Self-pay | Admitting: Physician Assistant

## 2023-12-10 ENCOUNTER — Ambulatory Visit (INDEPENDENT_AMBULATORY_CARE_PROVIDER_SITE_OTHER): Payer: Self-pay | Admitting: Physician Assistant

## 2023-12-10 VITALS — BP 110/90 | HR 74 | Temp 97.6°F | Ht 64.5 in | Wt 161.0 lb

## 2023-12-10 DIAGNOSIS — R3915 Urgency of urination: Secondary | ICD-10-CM

## 2023-12-10 DIAGNOSIS — N3000 Acute cystitis without hematuria: Secondary | ICD-10-CM

## 2023-12-10 DIAGNOSIS — R109 Unspecified abdominal pain: Secondary | ICD-10-CM

## 2023-12-10 LAB — POCT URINALYSIS DIPSTICK (MANUAL)
Nitrite, UA: NEGATIVE
Poct Glucose: NORMAL mg/dL
Poct Urobilinogen: NORMAL mg/dL — AB
Spec Grav, UA: 1.015 (ref 1.010–1.025)
pH, UA: 6 (ref 5.0–8.0)

## 2023-12-10 MED ORDER — CEPHALEXIN 500 MG PO CAPS: 500.0000 mg | ORAL_CAPSULE | Freq: Two times a day (BID) | ORAL | 0 refills | Status: AC

## 2023-12-10 NOTE — Progress Notes (Signed)
 Therapist, [facility] [address] [place], [place] [address]   Office Visit Note  Patient Name: Patricia Ramirez Date of Birth [id number]  Medical Record number [id number]  Date of Service: 12/10/2023  Chief Complaint  Patient presents with   Acute Visit    Patient c/o burning and urgency when urinating. She has also been having pain in her back and chest due to constipation. She was seen in the ER for constipation early this week but has had difficulty taking laxatives as prescribed due to vomiting after drinking Miralax.     HPI Pt is here for a sick visit. She is a type 1 diabetic. Went to the ER twice this past week, for abdominal pain. States she just started Mounjaro about 6wks ago, developed severe constipation, was having nausea. Went to the ED, CT scan done which was negative, labs not super concerning, HCG neg, given miralax but couldn't keep it down. Went back to ED, gave her an enema and dulcolax, was able to have BMs that day. Has continued having small BMs during the week. She's still taking miralax BID.  Coming in today to f/up from her ED visits. Still having pain in upper abdomen, but much better than before.   Also wanted to make sure she didn't have a UTI. Started having UTI symptoms yesterday morning, having dysuria. Increased frequency, sensation of incomplete emptying.  No hematuria, but urine is dark.  Hasn't used any meds for the UTI symptoms.  No fevers/chills, CP, SOB, ongoing n/v, diarrhea, vaginal bleeding/discharge, no other symptoms.    ROS: Review of Systems  Constitutional:  Negative for chills and fever.  Respiratory:  Negative for shortness of breath.   Cardiovascular:  Negative for chest pain.  Gastrointestinal:  Positive for abdominal pain (resolving) and constipation (resolving). Negative for diarrhea, nausea and vomiting.  Genitourinary:  Positive for dysuria, frequency and urgency. Negative for genital sores, hematuria and pelvic pain.   Musculoskeletal:  Negative for arthralgias and myalgias.  Skin:  Negative for color change.  Allergic/Immunologic: Positive for immunocompromised state.  Neurological:  Negative for weakness and numbness.     Current Medication:  Outpatient Encounter Medications as of 12/10/2023  Medication Sig   cephALEXin  (KEFLEX ) 500 MG capsule Take 1 capsule (500 mg total) by mouth 2 (two) times daily for 5 days.   Cholecalciferol 50 MCG (2000 UT) TABS Take by mouth daily.   Continuous Glucose Sensor (DEXCOM G6 SENSOR) MISC SMARTSIG:Topical Every 10 Days   Cyanocobalamin  (B-12 PO) Take by mouth daily.   famotidine  (PEPCID ) 20 MG tablet TAKE 1 TABLET BY MOUTH TWICE A DAY   gabapentin (NEURONTIN) 300 MG capsule Take 1 capsule by mouth 3 (three) times daily.   glucagon 1 MG injection as directed.   hydrOXYzine  (ATARAX ) 10 MG tablet TAKE 1 TABLET BY MOUTH EVERYDAY AT BEDTIME   insulin  aspart (NOVOLOG) 100 UNIT/ML injection INJECT SUBCUTANEOUSLY VIA INSULIN  PUMP UP TO 90 UNITS/DAY   Insulin  Disposable Pump (OMNIPOD 5 DEXG7G6 PODS GEN 5) MISC SMARTSIG:1 Each SUB-Q Every Other Day   Insulin  Disposable Pump (OMNIPOD 5 G6 INTRO, GEN 5,) KIT Inject into the skin.   levothyroxine (SYNTHROID) 88 MCG tablet Take 88 mcg by mouth daily before breakfast.   MAGNESIUM CITRATE PO Take by mouth daily.   ondansetron  (ZOFRAN ) 8 MG tablet Take 1 tablet (8 mg total) by mouth every 8 (eight) hours as needed for nausea or vomiting.   QULIPTA 60 MG TABS Take 60 mg by mouth daily.  SUMAtriptan (IMITREX) 50 MG tablet as needed.   valACYclovir  (VALTREX ) 500 MG tablet TAKE 1 TABLET BY MOUTH DAILY. CAN INCREASE TO TWICE A DAY FOR 5 DAYS IN THE EVENT OF A RECURRENCE   MOUNJARO 5 MG/0.5ML Pen Inject 5 mg into the skin once a week. (Patient not taking: Reported on 12/10/2023)   No facility-administered encounter medications on file as of 12/10/2023.      Medical History: Past Medical History:  Diagnosis Date   Anemia    Graves'  disease in remission    Type 1 diabetes (HCC)      Vital Signs: BP (!) 110/90   Pulse 74   Temp 97.6 F (36.4 C)   Ht 5' 4.5 (1.638 m)   Wt 73 kg   SpO2 95%   BMI 27.21 kg/m    Physical Exam Vitals and nursing note reviewed.  Constitutional:      General: She is not in acute distress.    Appearance: Normal appearance. She is well-developed. She is not toxic-appearing.     Comments: Afebrile, nontoxic, NAD  HENT:     Head: Normocephalic and atraumatic.     Mouth/Throat:     Mouth: Mucous membranes are dry.  Eyes:     General:        Right eye: No discharge.        Left eye: No discharge.     Conjunctiva/sclera: Conjunctivae normal.  Cardiovascular:     Rate and Rhythm: Normal rate and regular rhythm.     Pulses: Normal pulses.     Heart sounds: Normal heart sounds, S1 normal and S2 normal. No murmur heard.    No friction rub. No gallop.  Pulmonary:     Effort: Pulmonary effort is normal. No respiratory distress.     Breath sounds: Normal breath sounds. No decreased breath sounds, wheezing, rhonchi or rales.  Abdominal:     General: Bowel sounds are normal. There is no distension.     Palpations: Abdomen is soft. Abdomen is not rigid.     Tenderness: There is abdominal tenderness in the suprapubic area. There is no right CVA tenderness, left CVA tenderness, guarding or rebound. Negative signs include McBurney's sign.     Comments: Soft, nondistended, +BS throughout, with minimal upper abd discomfort, but mild suprapubic TTP, no r/g/r, neg murphy's, neg mcburney's, no CVA TTP   Musculoskeletal:        General: Normal range of motion.     Cervical back: Normal range of motion and neck supple.  Skin:    General: Skin is warm and dry.     Findings: No rash.  Neurological:     Mental Status: She is alert and oriented to person, place, and time.     Sensory: Sensation is intact. No sensory deficit.     Motor: Motor function is intact.  Psychiatric:        Mood and  Affect: Mood and affect normal.        Behavior: Behavior normal.       Assessment/Plan:   ICD-10-CM   1. Urinary urgency  R39.15 POCT Urinalysis Dip Manual    2. Acute cystitis without hematuria  N30.00     3. Abdominal pain, unspecified abdominal location  R10.9        Pt here with two issues: 1) UTI: -Pt here with 1 day of UTI symptoms, exam reveals mild suprapubic TTP but otherwise benign.   -U/A with trace leuks, some Hgb, no nitrites.  Preg test in ED negative this week  -Overall, eval c/w UTI. Doubt need for further work up today, doubt need for Upreg/STD testing/UCx today.  -Will rx Keflex  500mg  BID x5d.  -Doubt need for IM abx today.  -Advised use of OTC remedies for symptomatic relief, adequate hydration.  -F/up with PCP in 1wk for recheck. -Strict ED/return precautions advised.   2) Abd pain/constipation f/up: -Pt also wanting to f/up from her ED visits this week, having BMs but small, not much, but abd pain much better.  -Exam reassuring, no significant abd tenderness elsewhere in abdomen, labs from ED reviewed and reassuring, Cr was elevated but has been for at least a year.  -Overall, suspect constipation was the largest reason for her abd pain this week, and evaluation was reassuring, and now symptoms improving. Discussed continued use of miralax BID until daily BMs achieved, if too loose then could back off to daily dosing, and then titrate as appropriate to achieve daily soft stools. Discussed could use colace instead, or in addition, if needed. Adequate hydration a must! -F/up with PCP in 1wk for recheck. -Strict ED/return precautions advised.   General Counseling: Jaryiah verbalizes understanding of the findings of todays visit and agrees with plan of treatment. I have discussed any further diagnostic evaluation that may be needed or ordered today. We also reviewed her medications today. she has been encouraged to call the office with any questions or concerns that  should arise related to todays visit.   Orders Placed This Encounter  Procedures   POCT Urinalysis Dip Manual   Results for orders placed or performed in visit on 12/10/23 (from the past 24 hours)  POCT Urinalysis Dip Manual     Status: Abnormal   Collection Time: 12/10/23  8:25 AM  Result Value Ref Range   Spec Grav, UA 1.015 1.010 - 1.025   pH, UA 6.0 5.0 - 8.0   Leukocytes, UA Trace (A) Negative   Nitrite, UA Negative Negative   Poct Protein trace Negative, trace mg/dL   Poct Glucose Normal Normal mg/dL   Poct Ketones + small (A) Negative   Poct Urobilinogen Normal (A) Normal mg/dL   Poct Bilirubin + (A) Negative   Poct Blood trace Negative, trace     Meds ordered this encounter  Medications   cephALEXin  (KEFLEX ) 500 MG capsule    Sig: Take 1 capsule (500 mg total) by mouth 2 (two) times daily for 5 days.    Dispense:  10 capsule    Refill:  0    Supervising Provider:   BACIGALUPO, ANGELA M [[id number]]    Time spent: [address] PA-C  [employer]

## 2023-12-30 ENCOUNTER — Encounter: Payer: Self-pay | Admitting: Obstetrics and Gynecology

## 2023-12-30 ENCOUNTER — Ambulatory Visit (INDEPENDENT_AMBULATORY_CARE_PROVIDER_SITE_OTHER): Payer: BC Managed Care – PPO | Admitting: Obstetrics and Gynecology

## 2023-12-30 VITALS — BP 126/86 | HR 85 | Ht 64.5 in | Wt 174.9 lb

## 2023-12-30 DIAGNOSIS — Z01419 Encounter for gynecological examination (general) (routine) without abnormal findings: Secondary | ICD-10-CM | POA: Diagnosis not present

## 2023-12-30 MED ORDER — LORAZEPAM 1 MG PO TABS: 1.0000 mg | ORAL_TABLET | Freq: Once | ORAL | 0 refills | Status: AC

## 2023-12-30 NOTE — Progress Notes (Signed)
 Patricia Ramirez

## 2023-12-30 NOTE — Progress Notes (Signed)
 HPI:      [name] is a 28 y.o. G0P0000 who LMP was No LMP recorded. (Menstrual status: IUD).  Subjective:   She presents today for her annual examination.  She has no complaints.  She has an IUD for birth control and is not having menses.  Has rare occasional spotting.  She does check the strings. The IUD is approximately 30 years old (Mirena).  She is scheduled to have it removed and replaced in March.  She has some trepidation regarding this because her original insertion was somewhat traumatic and painful for her.    Hx: The following portions of the patient's history were reviewed and updated as appropriate:             She  has a past medical history of Anemia, Graves' disease in remission, and Type 1 diabetes (HCC). She does not have any pertinent problems on file. She  has a past surgical history that includes Colonoscopy (2004) and Wisdom tooth extraction (2019). Her family history includes Breast cancer in her maternal grandmother; Glaucoma in her maternal grandfather; Hypertension in her father and paternal grandfather; Prostate cancer in her maternal grandfather. She  reports that she has never smoked. She has never used smokeless tobacco. She reports current alcohol use. She reports that she does not use drugs. She has a current medication list which includes the following prescription(s): cholecalciferol, dexcom g6 sensor, cyanocobalamin, famotidine, gabapentin, glucagon, hydroxyzine, omnipod 5 dexg7g6 pods gen 5, omnipod 5 dexg7g6 intro gen 5, levothyroxine, lorazepam, magnesium citrate, novolog, ondansetron, ozempic (1 mg/dose), qulipta, sumatriptan, valacyclovir, gabapentin, and mounjaro. She is allergic to tuberculin ppd, tuberculin tests, nitrofurantoin, fluconazole, naproxen, naproxen sodium, other, and tuberculin ppd.       Review of Systems:  Review of Systems  Constitutional: Denied constitutional symptoms, night sweats, recent illness, fatigue, fever, insomnia and  weight loss.  Eyes: Denied eye symptoms, eye pain, photophobia, vision change and visual disturbance.  Ears/Nose/Throat/Neck: Denied ear, nose, throat or neck symptoms, hearing loss, nasal discharge, sinus congestion and sore throat.  Cardiovascular: Denied cardiovascular symptoms, arrhythmia, chest pain/pressure, edema, exercise intolerance, orthopnea and palpitations.  Respiratory: Denied pulmonary symptoms, asthma, pleuritic pain, productive sputum, cough, dyspnea and wheezing.  Gastrointestinal: Denied, gastro-esophageal reflux, melena, nausea and vomiting.  Genitourinary: Denied genitourinary symptoms including symptomatic vaginal discharge, pelvic relaxation issues, and urinary complaints.  Musculoskeletal: Denied musculoskeletal symptoms, stiffness, swelling, muscle weakness and myalgia.  Dermatologic: Denied dermatology symptoms, rash and scar.  Neurologic: Denied neurology symptoms, dizziness, headache, neck pain and syncope.  Psychiatric: Denied psychiatric symptoms, anxiety and depression.  Endocrine: Denied endocrine symptoms including hot flashes and night sweats.   Meds:   Current Outpatient Medications on File Prior to Visit  Medication Sig Dispense Refill   Cholecalciferol 50 MCG (2000 UT) TABS Take by mouth daily.     Continuous Glucose Sensor (DEXCOM G6 SENSOR) MISC SMARTSIG:Topical Every 10 Days     Cyanocobalamin (B-12 PO) Take by mouth daily.     famotidine (PEPCID) 20 MG tablet TAKE 1 TABLET BY MOUTH TWICE A DAY 180 tablet 2   gabapentin (NEURONTIN) 100 MG capsule Take by mouth 3 (three) times daily.     glucagon 1 MG injection as directed.     hydrOXYzine (ATARAX) 10 MG tablet TAKE 1 TABLET BY MOUTH EVERYDAY AT BEDTIME 90 tablet 0   Insulin Disposable Pump (OMNIPOD 5 DEXG7G6 PODS GEN 5) MISC SMARTSIG:1 Each SUB-Q Every Other Day     Insulin Disposable Pump (OMNIPOD 5  G6 INTRO, GEN 5,) KIT Inject into the skin.     levothyroxine (SYNTHROID) 88 MCG tablet Take 88 mcg by  mouth daily before breakfast.     MAGNESIUM CITRATE PO Take by mouth daily.     NOVOLOG 100 UNIT/ML injection FOR USE IN INSULIN PUMP. USE AT LEAST 3 TIMES A DAY. USE UP TO 100 UNITS PER DAY.     ondansetron (ZOFRAN) 8 MG tablet Take 1 tablet (8 mg total) by mouth every 8 (eight) hours as needed for nausea or vomiting. 20 tablet 0   OZEMPIC, 1 MG/DOSE, 4 MG/3ML SOPN Inject 1 mg into the skin once a week.     QULIPTA 60 MG TABS Take 60 mg by mouth daily.     SUMAtriptan (IMITREX) 50 MG tablet as needed.     valACYclovir (VALTREX) 500 MG tablet TAKE 1 TABLET BY MOUTH DAILY. CAN INCREASE TO TWICE A DAY FOR 5 DAYS IN THE EVENT OF A RECURRENCE 90 tablet 0   gabapentin (NEURONTIN) 300 MG capsule Take 1 capsule by mouth 3 (three) times daily. (Patient not taking: Reported on 12/30/2023)     MOUNJARO 5 MG/0.5ML Pen Inject 5 mg into the skin once a week. (Patient not taking: Reported on 12/10/2023)     No current facility-administered medications on file prior to visit.     Objective:     Vitals:   12/30/23 1504  BP: 126/86  Pulse: 85    Filed Weights   12/30/23 1504  Weight: 174 lb 14.4 oz (79.3 kg)              Physical examination General NAD, Conversant  HEENT Atraumatic; Op clear with mmm.  Normo-cephalic.  Anicteric sclerae  Thyroid/Neck Smooth without nodularity or enlargement. Normal ROM.  Neck Supple.  Skin No rashes, lesions or ulceration. Normal palpated skin turgor. No nodularity.  Breasts: No masses or discharge.  Symmetric.  No axillary adenopathy.  Lungs: Clear to auscultation.No rales or wheezes. Normal Respiratory effort, no retractions.  Heart: NSR.  No murmurs or rubs appreciated. No peripheral edema  Abdomen: Soft.  Non-tender.  No masses.  No HSM. No hernia  Extremities: Moves all appropriately.  Normal ROM for age. No lymphadenopathy.  Neuro: Oriented to PPT.  Normal mood. Normal affect.     Pelvic: Deferred by patient-no pelvic complaints.    Assessment:     G0P0000 Patient Active Problem List   Diagnosis Date Noted   Carpal tunnel syndrome of right wrist 08/10/2023   Syncopal seizure (HCC) 08/10/2023   Hypothyroidism 04/15/2023   Hives 04/15/2023   Stage 3a chronic kidney disease (HCC) 04/15/2023   Sickle cell trait (HCC) 11/25/2021   RBC microcytosis 05/16/2021   Iron deficiency anemia 05/16/2021   IUD (intrauterine device) in place 08/28/2019   Insulin pump status 01/13/2019   Graves disease 12/14/2018   Herpes simplex infection 10/29/2017   Anemia 06/05/2014   Type 1 diabetes mellitus (HCC) 06/05/2014     1. Well woman exam with routine gynecological exam        Plan:            1.  Basic Screening Recommendations The basic screening recommendations for asymptomatic women were discussed with the patient during her visit.  The age-appropriate recommendations were discussed with her and the rational for the tests reviewed.  When I am informed by the patient that another primary care physician has previously obtained the age-appropriate tests and they are up-to-date, only outstanding tests are  ordered and referrals given as necessary.  Abnormal results of tests will be discussed with her when all of her results are completed.  Routine preventative health maintenance measures emphasized: Exercise/Diet/Weight control, Tobacco Warnings, Alcohol/Substance use risks and Stress Management 2.  Plan IUD removal as scheduled.  Patient would like to be premedicated with Ativan and ibuprofen.  We have discussed this in some detail.  She will present with another person to bring her to the office and take her back home.  Orders No orders of the defined types were placed in this encounter.    Meds ordered this encounter  Medications   LORazepam (ATIVAN) 1 MG tablet    Sig: Take 1 tablet (1 mg total) by mouth once for 1 dose. Take 30 min prior to office procedure    Dispense:  1 tablet    Refill:  0           F/U  No follow-ups on  file.  Elonda Husky, M.D. 12/30/2023 4:44 PM

## 2024-01-04 ENCOUNTER — Ambulatory Visit: Payer: BC Managed Care – PPO | Admitting: Obstetrics and Gynecology

## 2024-01-05 ENCOUNTER — Ambulatory Visit: Payer: BC Managed Care – PPO | Admitting: Obstetrics and Gynecology

## 2024-01-05 ENCOUNTER — Encounter: Payer: Self-pay | Admitting: Obstetrics and Gynecology

## 2024-01-05 VITALS — BP 137/83 | HR 85 | Ht 64.5 in | Wt 169.8 lb

## 2024-01-05 DIAGNOSIS — Z30433 Encounter for removal and reinsertion of intrauterine contraceptive device: Secondary | ICD-10-CM

## 2024-01-05 NOTE — Progress Notes (Signed)
Patient presents today for IUD removal and insertion. She states no other questions or concerns.

## 2024-01-05 NOTE — Progress Notes (Signed)
 HPI:      [name] is a 28 y.o. G0P0000 who LMP was No LMP recorded. (Menstrual status: IUD).  Subjective:   She presents today for IUD removal and re insertion.  She had a poor experience with her last IUD insertion and has taken Ativan and Motrin prior to this one.  She is accompanied by her mother.    Hx: The following portions of the patient's history were reviewed and updated as appropriate:             She  has a past medical history of Anemia, Graves' disease in remission, and Type 1 diabetes (HCC). She does not have any pertinent problems on file. She  has a past surgical history that includes Colonoscopy (2004) and Wisdom tooth extraction (2019). Her family history includes Breast cancer in her maternal grandmother; Glaucoma in her maternal grandfather; Hypertension in her father and paternal grandfather; Prostate cancer in her maternal grandfather. She  reports that she has never smoked. She has never used smokeless tobacco. She reports current alcohol use. She reports that she does not use drugs. She has a current medication list which includes the following prescription(s): cholecalciferol, dexcom g6 sensor, cyanocobalamin, famotidine, gabapentin, gabapentin, glucagon, hydroxyzine, omnipod 5 dexg7g6 pods gen 5, omnipod 5 dexg7g6 intro gen 5, levothyroxine, magnesium citrate, novolog, ondansetron, ozempic (1 mg/dose), qulipta, sumatriptan, and valacyclovir. She is allergic to tuberculin ppd, tuberculin tests, nitrofurantoin, fluconazole, naproxen, naproxen sodium, other, and tuberculin ppd.       Review of Systems:  Review of Systems  Constitutional: Denied constitutional symptoms, night sweats, recent illness, fatigue, fever, insomnia and weight loss.  Eyes: Denied eye symptoms, eye pain, photophobia, vision change and visual disturbance.  Ears/Nose/Throat/Neck: Denied ear, nose, throat or neck symptoms, hearing loss, nasal discharge, sinus congestion and sore throat.   Cardiovascular: Denied cardiovascular symptoms, arrhythmia, chest pain/pressure, edema, exercise intolerance, orthopnea and palpitations.  Respiratory: Denied pulmonary symptoms, asthma, pleuritic pain, productive sputum, cough, dyspnea and wheezing.  Gastrointestinal: Denied, gastro-esophageal reflux, melena, nausea and vomiting.  Genitourinary: Denied genitourinary symptoms including symptomatic vaginal discharge, pelvic relaxation issues, and urinary complaints.  Musculoskeletal: Denied musculoskeletal symptoms, stiffness, swelling, muscle weakness and myalgia.  Dermatologic: Denied dermatology symptoms, rash and scar.  Neurologic: Denied neurology symptoms, dizziness, headache, neck pain and syncope.  Psychiatric: Denied psychiatric symptoms, anxiety and depression.  Endocrine: Denied endocrine symptoms including hot flashes and night sweats.   Meds:   Current Outpatient Medications on File Prior to Visit  Medication Sig Dispense Refill   Cholecalciferol 50 MCG (2000 UT) TABS Take by mouth daily.     Continuous Glucose Sensor (DEXCOM G6 SENSOR) MISC SMARTSIG:Topical Every 10 Days     Cyanocobalamin (B-12 PO) Take by mouth daily.     famotidine (PEPCID) 20 MG tablet TAKE 1 TABLET BY MOUTH TWICE A DAY 180 tablet 2   gabapentin (NEURONTIN) 100 MG capsule Take by mouth 3 (three) times daily.     gabapentin (NEURONTIN) 300 MG capsule Take 1 capsule by mouth 3 (three) times daily.     glucagon 1 MG injection as directed.     hydrOXYzine (ATARAX) 10 MG tablet TAKE 1 TABLET BY MOUTH EVERYDAY AT BEDTIME 90 tablet 0   Insulin Disposable Pump (OMNIPOD 5 DEXG7G6 PODS GEN 5) MISC SMARTSIG:1 Each SUB-Q Every Other Day     Insulin Disposable Pump (OMNIPOD 5 G6 INTRO, GEN 5,) KIT Inject into the skin.     levothyroxine (SYNTHROID) 88 MCG tablet Take 88 mcg  by mouth daily before breakfast.     MAGNESIUM CITRATE PO Take by mouth daily.     NOVOLOG 100 UNIT/ML injection FOR USE IN INSULIN PUMP. USE AT  LEAST 3 TIMES A DAY. USE UP TO 100 UNITS PER DAY.     ondansetron (ZOFRAN) 8 MG tablet Take 1 tablet (8 mg total) by mouth every 8 (eight) hours as needed for nausea or vomiting. 20 tablet 0   OZEMPIC, 1 MG/DOSE, 4 MG/3ML SOPN Inject 1 mg into the skin once a week.     QULIPTA 60 MG TABS Take 60 mg by mouth daily.     SUMAtriptan (IMITREX) 50 MG tablet as needed.     valACYclovir (VALTREX) 500 MG tablet TAKE 1 TABLET BY MOUTH DAILY. CAN INCREASE TO TWICE A DAY FOR 5 DAYS IN THE EVENT OF A RECURRENCE 90 tablet 0   No current facility-administered medications on file prior to visit.    Objective:     Vitals:   01/05/24 0839  BP: 137/83  Pulse: 85    Physical examination   Pelvic:   Vulva: Normal appearance.  No lesions.  Vagina: No lesions or abnormalities noted.  Support: Normal pelvic support.  Urethra No masses tenderness or scarring.  Meatus Normal size without lesions or prolapse.  Cervix: Normal appearance.  No lesions.  Strings present at cervical os  Anus: Normal exam.  No lesions.  Perineum: Normal exam.  No lesions.        Bimanual   Uterus: Normal size.  Non-tender.  Mobile.  Mid position  Adnexae: No masses.  Non-tender to palpation.  Cul-de-sac: Negative for abnormality.   IUD Removal Strings of IUD identified and grasped.  IUD removed without problem.  Pt tolerated this well.  IUD noted to be intact.   IUD Procedure  The cervix was cleansed with betadine solution.  After sounding the uterus and noting the position, the IUD was placed in the usual manner without problem.  The string was cut to the appropriate length.  The patient tolerated the procedure well.            [facility] # = [id number]   Assessment:    G0P0000 Patient Active Problem List   Diagnosis Date Noted   Carpal tunnel syndrome of right wrist 08/10/2023   Syncopal seizure (HCC) 08/10/2023   Hypothyroidism 04/15/2023   Hives 04/15/2023   Stage 3a chronic kidney disease (HCC) 04/15/2023    Sickle cell trait (HCC) 11/25/2021   RBC microcytosis 05/16/2021   Iron deficiency anemia 05/16/2021   IUD (intrauterine device) in place 08/28/2019   Insulin pump status 01/13/2019   Graves disease 12/14/2018   Herpes simplex infection 10/29/2017   Anemia 06/05/2014   Type 1 diabetes mellitus (HCC) 06/05/2014     1. Encounter for IUD removal and reinsertion     Patient tolerated well  Plan:             F/U  Return in about 4 weeks (around 02/02/2024) for For IUD f/u.  Elonda Husky, M.D. 01/05/2024 9:18 AM

## 2024-01-18 DIAGNOSIS — E1165 Type 2 diabetes mellitus with hyperglycemia: Secondary | ICD-10-CM | POA: Diagnosis not present

## 2024-01-18 DIAGNOSIS — E139 Other specified diabetes mellitus without complications: Secondary | ICD-10-CM | POA: Diagnosis not present

## 2024-01-18 DIAGNOSIS — Z13220 Encounter for screening for lipoid disorders: Secondary | ICD-10-CM | POA: Diagnosis not present

## 2024-01-18 DIAGNOSIS — Z794 Long term (current) use of insulin: Secondary | ICD-10-CM | POA: Diagnosis not present

## 2024-01-24 DIAGNOSIS — Z8669 Personal history of other diseases of the nervous system and sense organs: Secondary | ICD-10-CM | POA: Diagnosis not present

## 2024-01-24 DIAGNOSIS — Z87898 Personal history of other specified conditions: Secondary | ICD-10-CM | POA: Diagnosis not present

## 2024-01-24 DIAGNOSIS — G250 Essential tremor: Secondary | ICD-10-CM | POA: Diagnosis not present

## 2024-01-24 DIAGNOSIS — G5603 Carpal tunnel syndrome, bilateral upper limbs: Secondary | ICD-10-CM | POA: Diagnosis not present

## 2024-01-28 ENCOUNTER — Ambulatory Visit: Admitting: Obstetrics and Gynecology

## 2024-02-01 ENCOUNTER — Encounter: Payer: Self-pay | Admitting: Obstetrics and Gynecology

## 2024-02-01 ENCOUNTER — Ambulatory Visit: Admitting: Obstetrics and Gynecology

## 2024-02-01 VITALS — BP 120/80 | HR 80 | Ht 64.5 in | Wt 166.0 lb

## 2024-02-01 DIAGNOSIS — Z30431 Encounter for routine checking of intrauterine contraceptive device: Secondary | ICD-10-CM | POA: Diagnosis not present

## 2024-02-01 NOTE — Progress Notes (Signed)
 Patient presents today for IUD string check. She states no longer bleeding . Patient reports no pain, discomfort or trouble with intercourse. No other questions or concerns.

## 2024-02-01 NOTE — Progress Notes (Signed)
 HPI:      [name] is a 28 y.o. G0P0000 who LMP was No LMP recorded. (Menstrual status: IUD).  Subjective:   She presents today for IUD follow-up.  She had a removal and reinsertion.  She reports she is not having any significant problems.  Has occasional cramping and had some spotting for the first week after insertion.  Has had intercourse without issue.    Hx: The following portions of the patient's history were reviewed and updated as appropriate:             She  has a past medical history of Anemia, Graves' disease in remission, and Type 1 diabetes (HCC). She does not have any pertinent problems on file. She  has a past surgical history that includes Colonoscopy (2004) and Wisdom tooth extraction (2019). Her family history includes Breast cancer in her maternal grandmother; Glaucoma in her maternal grandfather; Hypertension in her father and paternal grandfather; Prostate cancer in her maternal grandfather. She  reports that she has never smoked. She has never used smokeless tobacco. She reports current alcohol use. She reports that she does not use drugs. She has a current medication list which includes the following prescription(s): cholecalciferol, dexcom g6 sensor, cyanocobalamin, famotidine, gabapentin, gabapentin, glucagon, hydroxyzine, omnipod 5 dexg7g6 pods gen 5, omnipod 5 dexg7g6 intro gen 5, levonorgestrel, levothyroxine, magnesium citrate, novolog, ondansetron, ozempic (1 mg/dose), primidone, qulipta, rosuvastatin, sumatriptan, and valacyclovir. She is allergic to tuberculin ppd, tuberculin tests, nitrofurantoin, fluconazole, naproxen, naproxen sodium, other, and tuberculin ppd.       Review of Systems:  Review of Systems  Constitutional: Denied constitutional symptoms, night sweats, recent illness, fatigue, fever, insomnia and weight loss.  Eyes: Denied eye symptoms, eye pain, photophobia, vision change and visual disturbance.  Ears/Nose/Throat/Neck: Denied ear, nose,  throat or neck symptoms, hearing loss, nasal discharge, sinus congestion and sore throat.  Cardiovascular: Denied cardiovascular symptoms, arrhythmia, chest pain/pressure, edema, exercise intolerance, orthopnea and palpitations.  Respiratory: Denied pulmonary symptoms, asthma, pleuritic pain, productive sputum, cough, dyspnea and wheezing.  Gastrointestinal: Denied, gastro-esophageal reflux, melena, nausea and vomiting.  Genitourinary: Denied genitourinary symptoms including symptomatic vaginal discharge, pelvic relaxation issues, and urinary complaints.  Musculoskeletal: Denied musculoskeletal symptoms, stiffness, swelling, muscle weakness and myalgia.  Dermatologic: Denied dermatology symptoms, rash and scar.  Neurologic: Denied neurology symptoms, dizziness, headache, neck pain and syncope.  Psychiatric: Denied psychiatric symptoms, anxiety and depression.  Endocrine: Denied endocrine symptoms including hot flashes and night sweats.   Meds:   Current Outpatient Medications on File Prior to Visit  Medication Sig Dispense Refill   Cholecalciferol 50 MCG (2000 UT) TABS Take by mouth daily.     Continuous Glucose Sensor (DEXCOM G6 SENSOR) MISC SMARTSIG:Topical Every 10 Days     Cyanocobalamin (B-12 PO) Take by mouth daily.     famotidine (PEPCID) 20 MG tablet TAKE 1 TABLET BY MOUTH TWICE A DAY 180 tablet 2   gabapentin (NEURONTIN) 100 MG capsule Take by mouth 3 (three) times daily.     gabapentin (NEURONTIN) 300 MG capsule Take 1 capsule by mouth 3 (three) times daily.     glucagon 1 MG injection as directed.     hydrOXYzine (ATARAX) 10 MG tablet TAKE 1 TABLET BY MOUTH EVERYDAY AT BEDTIME 90 tablet 0   Insulin Disposable Pump (OMNIPOD 5 DEXG7G6 PODS GEN 5) MISC SMARTSIG:1 Each SUB-Q Every Other Day     Insulin Disposable Pump (OMNIPOD 5 G6 INTRO, GEN 5,) KIT Inject into the skin.  levonorgestrel (MIRENA) 20 MCG/DAY IUD 1 each by Intrauterine route once.     levothyroxine (SYNTHROID) 88  MCG tablet Take 88 mcg by mouth daily before breakfast.     MAGNESIUM CITRATE PO Take by mouth daily.     NOVOLOG 100 UNIT/ML injection FOR USE IN INSULIN PUMP. USE AT LEAST 3 TIMES A DAY. USE UP TO 100 UNITS PER DAY.     ondansetron (ZOFRAN) 8 MG tablet Take 1 tablet (8 mg total) by mouth every 8 (eight) hours as needed for nausea or vomiting. 20 tablet 0   OZEMPIC, 1 MG/DOSE, 4 MG/3ML SOPN Inject 1 mg into the skin once a week.     primidone (MYSOLINE) 50 MG tablet Take 25 mg by mouth 4 (four) times daily.     QULIPTA 60 MG TABS Take 60 mg by mouth daily.     rosuvastatin (CRESTOR) 5 MG tablet Take 5 mg by mouth daily.     SUMAtriptan (IMITREX) 50 MG tablet as needed.     valACYclovir (VALTREX) 500 MG tablet TAKE 1 TABLET BY MOUTH DAILY. CAN INCREASE TO TWICE A DAY FOR 5 DAYS IN THE EVENT OF A RECURRENCE 90 tablet 0   No current facility-administered medications on file prior to visit.      Objective:     Vitals:   02/01/24 1101  BP: 120/80  Pulse: 80   Filed Weights   02/01/24 1101  Weight: 166 lb (75.3 kg)              Physical examination   Pelvic:   Vulva: Normal appearance.  No lesions.  Vagina: No lesions or abnormalities noted.  Support: Normal pelvic support.  Urethra No masses tenderness or scarring.  Meatus Normal size without lesions or prolapse.  Cervix: Normal appearance.  No lesions. IUD strings noted at cervical os.  Anus: Normal exam.  No lesions.  Perineum: Normal exam.  No lesions.        Bimanual   Uterus: Normal size.  Non-tender.  Mobile.  AV.  Adnexae: No masses.  Non-tender to palpation.  Cul-de-sac: Negative for abnormality.             Assessment:    G0P0000 Patient Active Problem List   Diagnosis Date Noted   Carpal tunnel syndrome of right wrist 08/10/2023   Syncopal seizure (HCC) 08/10/2023   Hypothyroidism 04/15/2023   Hives 04/15/2023   Stage 3a chronic kidney disease (HCC) 04/15/2023   Sickle cell trait (HCC) 11/25/2021   RBC  microcytosis 05/16/2021   Iron deficiency anemia 05/16/2021   IUD (intrauterine device) in place 08/28/2019   Insulin pump status 01/13/2019   Graves disease 12/14/2018   Herpes simplex infection 10/29/2017   Anemia 06/05/2014   Type 1 diabetes mellitus (HCC) 06/05/2014     1. Encounter for routine checking of intrauterine contraceptive device (IUD)     Doing well with IUD.   Plan:            1.  Follow-up for annual exam. Orders No orders of the defined types were placed in this encounter.   No orders of the defined types were placed in this encounter.     F/U  No follow-ups on file.  Elonda Husky, M.D. 02/01/2024 11:20 AM

## 2024-03-07 ENCOUNTER — Other Ambulatory Visit: Payer: Self-pay | Admitting: Obstetrics and Gynecology

## 2024-03-07 ENCOUNTER — Other Ambulatory Visit: Payer: Self-pay

## 2024-03-07 ENCOUNTER — Other Ambulatory Visit: Payer: Self-pay | Admitting: Nurse Practitioner

## 2024-03-07 DIAGNOSIS — Z8619 Personal history of other infectious and parasitic diseases: Secondary | ICD-10-CM

## 2024-03-07 DIAGNOSIS — L509 Urticaria, unspecified: Secondary | ICD-10-CM

## 2024-03-07 MED ORDER — VALACYCLOVIR HCL 500 MG PO TABS
500.0000 mg | ORAL_TABLET | Freq: Every day | ORAL | 0 refills | Status: DC
Start: 2024-03-07 — End: 2024-08-21

## 2024-03-10 NOTE — Telephone Encounter (Signed)
 Requested Prescriptions  Pending Prescriptions Disp Refills   hydrOXYzine  (ATARAX ) 10 MG tablet [Pharmacy Med Name: HYDROXYZINE  HCL 10 MG TABLET] 90 tablet 0    Sig: TAKE 1 TABLET BY MOUTH EVERYDAY AT BEDTIME     Ear, Nose, and Throat:  Antihistamines 2 Failed - 03/10/2024  7:50 AM      Failed - Cr in normal range and within 360 days    Creatinine, Ser  Date Value Ref Range Status  01/05/2023 1.29 (H) 0.57 - 1.00 mg/dL Final         Failed - Valid encounter within last 12 months    Recent Outpatient Visits   None     Future Appointments             In 7 months Abram Hoguet, Monalisa Angles, FNP [facility], [facility]

## 2024-05-24 DIAGNOSIS — Z1331 Encounter for screening for depression: Secondary | ICD-10-CM | POA: Diagnosis not present

## 2024-05-24 DIAGNOSIS — G250 Essential tremor: Secondary | ICD-10-CM | POA: Diagnosis not present

## 2024-05-24 DIAGNOSIS — R200 Anesthesia of skin: Secondary | ICD-10-CM | POA: Diagnosis not present

## 2024-05-24 DIAGNOSIS — G5603 Carpal tunnel syndrome, bilateral upper limbs: Secondary | ICD-10-CM | POA: Diagnosis not present

## 2024-06-08 DIAGNOSIS — N1831 Chronic kidney disease, stage 3a: Secondary | ICD-10-CM | POA: Diagnosis not present

## 2024-06-08 DIAGNOSIS — E1022 Type 1 diabetes mellitus with diabetic chronic kidney disease: Secondary | ICD-10-CM | POA: Diagnosis not present

## 2024-07-07 ENCOUNTER — Other Ambulatory Visit: Payer: Self-pay | Admitting: Nurse Practitioner

## 2024-07-07 DIAGNOSIS — L509 Urticaria, unspecified: Secondary | ICD-10-CM

## 2024-07-07 NOTE — Telephone Encounter (Signed)
 Requested medication (s) are due for refill today: yes  Requested medication (s) are on the active medication list: yes  Last refill:  03/10/24 #90  Future visit scheduled: yes  Notes to clinic:  overdue lab work    Requested Prescriptions  Pending Prescriptions Disp Refills   hydrOXYzine  (ATARAX ) 10 MG tablet [Pharmacy Med Name: HYDROXYZINE  HCL 10 MG TABLET] 90 tablet 0    Sig: TAKE 1 TABLET BY MOUTH EVERYDAY AT BEDTIME     Ear, Nose, and Throat:  Antihistamines 2 Failed - 07/07/2024 11:07 AM      Failed - Cr in normal range and within 360 days    Creatinine, Ser  Date Value Ref Range Status  01/05/2023 1.29 (H) 0.57 - 1.00 mg/dL Final         Failed - Valid encounter within last 12 months    Recent Outpatient Visits   None     Future Appointments             In 3 months [name], [name] [name], FNP [facility], [place]

## 2024-07-10 DIAGNOSIS — G5601 Carpal tunnel syndrome, right upper limb: Secondary | ICD-10-CM | POA: Diagnosis not present

## 2024-07-17 DIAGNOSIS — G5603 Carpal tunnel syndrome, bilateral upper limbs: Secondary | ICD-10-CM | POA: Diagnosis not present

## 2024-07-18 ENCOUNTER — Other Ambulatory Visit: Payer: Self-pay | Admitting: Nurse Practitioner

## 2024-07-18 DIAGNOSIS — L509 Urticaria, unspecified: Secondary | ICD-10-CM

## 2024-07-19 NOTE — Telephone Encounter (Signed)
 Requested by interface surescripts. Future visit 2 months.  Requested Prescriptions  Pending Prescriptions Disp Refills   famotidine  (PEPCID ) 20 MG tablet [Pharmacy Med Name: FAMOTIDINE  20 MG TABLET] 180 tablet 0    Sig: TAKE 1 TABLET BY MOUTH TWICE A DAY     Gastroenterology:  H2 Antagonists Failed - 07/19/2024 12:18 PM      Failed - Valid encounter within last 12 months    Recent Outpatient Visits   None     Future Appointments             In 2 months Patricia Ramirez, [name] [name], FNP [facility], [place]

## 2024-07-27 ENCOUNTER — Encounter: Payer: Self-pay | Admitting: Certified Nurse Midwife

## 2024-07-27 ENCOUNTER — Ambulatory Visit: Admitting: Certified Nurse Midwife

## 2024-07-27 VITALS — BP 115/75 | HR 74 | Resp 16 | Ht 64.5 in | Wt 169.1 lb

## 2024-07-27 DIAGNOSIS — N907 Vulvar cyst: Secondary | ICD-10-CM

## 2024-07-27 DIAGNOSIS — Z113 Encounter for screening for infections with a predominantly sexual mode of transmission: Secondary | ICD-10-CM | POA: Diagnosis not present

## 2024-07-27 NOTE — Progress Notes (Signed)
    GYNECOLOGY PROGRESS NOTE  Subjective:    Patient ID: Patricia Ramirez, female    DOB: 1996-03-03, 28 y.o.   MRN: [id number]  HPI  Patient is a 28 y.o. G34P0000 female who presents for evaluation of possible cyst or sores. She has noticed a sore on her labia about 2 days ago. She notes she was having vaginal pain about 3 days before the sores appeared. She has a history of having Bartholin's cyst.  The following portions of the patient's history were reviewed and updated as appropriate: allergies, current medications, past family history, past medical history, past social history, past surgical history, and problem list.  Review of Systems Pertinent items are noted in HPI.   Objective:   Blood pressure 115/75, pulse 74, resp. rate 16, height 5' 4.5 (1.638 m), weight 169 lb 1.6 oz (76.7 kg). Body mass index is 28.58 kg/m. General appearance: alert, cooperative, and no distress Pelvic: Small cyst,(83mm) located on the right labia minora, mobile, tender with scab present.     Assessment:   1. Routine screening for STI (sexually transmitted infection)   2. Labial cyst      Plan:   Labial cyst- Has already drained. Will monitor for now. Recommended warm compresses and can continue with Epson salt. Reviewed signs of infection and when to return to care. Unlikely HSV but swab collected.   Damien Parsley, CNM  [facility]

## 2024-07-31 LAB — HERPES SIMPLEX VIRUS CULTURE

## 2024-08-01 ENCOUNTER — Ambulatory Visit: Payer: Self-pay | Admitting: Certified Nurse Midwife

## 2024-08-07 DIAGNOSIS — Z794 Long term (current) use of insulin: Secondary | ICD-10-CM | POA: Diagnosis not present

## 2024-08-07 DIAGNOSIS — Z7985 Long-term (current) use of injectable non-insulin antidiabetic drugs: Secondary | ICD-10-CM | POA: Diagnosis not present

## 2024-08-07 DIAGNOSIS — E1165 Type 2 diabetes mellitus with hyperglycemia: Secondary | ICD-10-CM | POA: Diagnosis not present

## 2024-08-17 ENCOUNTER — Other Ambulatory Visit: Payer: Self-pay | Admitting: Obstetrics and Gynecology

## 2024-08-17 DIAGNOSIS — Z8619 Personal history of other infectious and parasitic diseases: Secondary | ICD-10-CM

## 2024-09-25 ENCOUNTER — Other Ambulatory Visit: Payer: Self-pay

## 2024-09-25 ENCOUNTER — Ambulatory Visit (INDEPENDENT_AMBULATORY_CARE_PROVIDER_SITE_OTHER): Payer: Self-pay | Admitting: Medical

## 2024-09-25 ENCOUNTER — Encounter: Payer: Self-pay | Admitting: Medical

## 2024-09-25 VITALS — BP 112/78 | HR 77 | Temp 97.8°F

## 2024-09-25 DIAGNOSIS — H6121 Impacted cerumen, right ear: Secondary | ICD-10-CM

## 2024-09-25 DIAGNOSIS — J029 Acute pharyngitis, unspecified: Secondary | ICD-10-CM

## 2024-09-25 LAB — POCT RAPID STREP A (OFFICE): Rapid Strep A Screen: NEGATIVE

## 2024-09-25 NOTE — Progress Notes (Signed)
 [facility] [address] [place], [place] [address]   Office Visit Note  Patient Name: Patricia Ramirez Date of Birth [id number]  Medical Record number [id number]  Date of Service: 09/25/2024  Chief Complaint  Patient presents with   Acute Visit    Sore throat, states that coworkers have been passing out strep, concerned for strep today, states that her throat has been hurting for 4 days, OTC pain medications-ibuprofen, tylenol, states that her blood sugars have been running for about a week- states that her body probably knew she was getting sick before she did      HPI Discussed the use of AI scribe software for clinical note transcription with the patient, who gave verbal consent to proceed.  History of Present Illness Patricia Ramirez is a 28 year old female with type 1 diabetes and chronic kidney disease who presents with a sore throat and head congestion.  Oropharyngeal symptoms - Sore throat present for four days with mild progressive worsening - Some odynophagia - Hoarseness noted (mild) - No cough  Nasal and upper respiratory symptoms - Mild nasal congestion for four days - Stuffy and runny nose with clear mucus - Frequent nose blowing  Constitutional symptoms - Low-grade fever of 99.108F yesterday, managed with Tylenol and Advil - Increased fatigue - Body aches  Glycemic control - Type 1 diabetes mellitus - Elevated blood glucose levels during illness, baseline around 200 mg/dL (usual 869 mg/dL) - Postprandial glucose spikes up to 350 mg/dL - Similar glycemic pattern during previous illnesses - Uses Dexcom continuous glucose monitor  Medication use - Symptom management with over-the-counter Tylenol and Advil  Exposure risk and preventive measures - Concern for possible streptococcal pharyngitis due to recent cases at workplace [employer]) - No recent contact with sick individuals at home - Denies any known exposures to COVID-19; Had COVID-19 a  few months ago. - No sharing of personal items with coworkers - Practices good hand hygiene  Renal history - History of kidney disease     Current Medication:  Outpatient Encounter Medications as of 09/25/2024  Medication Sig   Cholecalciferol 50 MCG (2000 UT) TABS Take by mouth daily.   Continuous Glucose Sensor (DEXCOM G6 SENSOR) MISC SMARTSIG:Topical Every 10 Days   Cyanocobalamin  (B-12 PO) Take by mouth daily.   famotidine  (PEPCID ) 20 MG tablet TAKE 1 TABLET BY MOUTH TWICE A DAY   gabapentin (NEURONTIN) 300 MG capsule Take 1 capsule by mouth 3 (three) times daily.   glucagon 1 MG injection as directed.   hydrOXYzine  (ATARAX ) 10 MG tablet TAKE 1 TABLET BY MOUTH EVERYDAY AT BEDTIME   Insulin  Disposable Pump (OMNIPOD 5 DEXG7G6 PODS GEN 5) MISC SMARTSIG:1 Each SUB-Q Every Other Day   Insulin  Disposable Pump (OMNIPOD 5 G6 INTRO, GEN 5,) KIT Inject into the skin.   levonorgestrel  (MIRENA ) 20 MCG/DAY IUD 1 each by Intrauterine route once.   MAGNESIUM CITRATE PO Take by mouth daily.   NOVOLOG 100 UNIT/ML injection FOR USE IN INSULIN  PUMP. USE AT LEAST 3 TIMES A DAY. USE UP TO 100 UNITS PER DAY.   ondansetron  (ZOFRAN ) 8 MG tablet Take 1 tablet (8 mg total) by mouth every 8 (eight) hours as needed for nausea or vomiting.   OZEMPIC, 1 MG/DOSE, 4 MG/3ML SOPN Inject 1 mg into the skin once a week.   primidone (MYSOLINE) 50 MG tablet Take 25 mg by mouth 4 (four) times daily.   QULIPTA 60 MG TABS Take 60 mg by mouth daily.  rosuvastatin (CRESTOR) 5 MG tablet Take 5 mg by mouth daily.   SUMAtriptan (IMITREX) 50 MG tablet as needed.   valACYclovir  (VALTREX ) 500 MG tablet TAKE 1 TABLET (500 MG TOTAL) BY MOUTH DAILY.   No facility-administered encounter medications on file as of 09/25/2024.      Medical History: Past Medical History:  Diagnosis Date   Anemia    Graves' disease in remission    Stage 3a chronic kidney disease (HCC)    Type 1 diabetes (HCC)      Vital Signs: BP  112/78   Pulse 77   Temp 97.8 F (36.6 C) (Tympanic)   SpO2 95% Comment: nail design on nails   Review of Systems See HPI  Physical Exam Vitals reviewed.  Constitutional:      General: She is not in acute distress.    Appearance: She is not ill-appearing.  HENT:     Head: Normocephalic.     Right Ear: Ear canal and external ear normal. There is impacted cerumen.     Left Ear: Tympanic membrane, ear canal and external ear normal.     Ears:     Comments: Unable to view right TM    Nose: Mucosal edema (slight) present. No congestion or rhinorrhea.     Mouth/Throat:     Mouth: Mucous membranes are moist. No oral lesions.     Pharynx: Posterior oropharyngeal erythema (slight) present. No pharyngeal swelling.     Tonsils: No tonsillar exudate. 0 on the right. 0 on the left.  Cardiovascular:     Rate and Rhythm: Normal rate and regular rhythm.     Heart sounds: No murmur heard.    No friction rub. No gallop.  Pulmonary:     Effort: Pulmonary effort is normal.     Breath sounds: Normal breath sounds. No wheezing, rhonchi or rales.  Musculoskeletal:     Cervical back: Neck supple. No rigidity.  Lymphadenopathy:     Cervical: Cervical adenopathy (Small, slightly tender anterior cervical nodes) present.  Neurological:     Mental Status: She is alert.     Results for orders placed or performed in visit on 09/25/24 (from the past 24 hours)  POCT rapid strep A     Status: Normal   Collection Time: 09/25/24  9:32 AM  Result Value Ref Range   Rapid Strep A Screen Negative Negative     Assessment/Plan: 1. Pharyngitis, unspecified etiology (Primary) - POCT rapid strep A  2. Impacted cerumen of right ear - Ear Lavage - performed successfully by CMA with removal of large chunk of cerumen. Ear re-examined - canal and TM normal. Assessment & Plan Acute pharyngitis Sore throat with head congestion, hoarseness, and low-grade fever. POC Strep A antigen test negative. Low suspicion  for COVID-19/flu. Clinical findings most consistent with viral infection. Offered option for throat culture, patient declined. - Continue Tylenol for pain relief. Limit Ibuprofen with your kidney disease, may take occasionally if approved by your primary care provider/nephrologist. - Gargle with warm salt water. - Use lozenges or cough drops. - Drink warm tea or other warm liquids. - Rest and stay hydrated. - You may use OTC cold medicines such as Coricidin products if your congestion worsen or you develop a cough. - Advised to schedule follow up visit or seek care with primary care/urgent care if new/worsening symptoms develop or your symptoms are not improving over the next 5-7 days.  Type 1 diabetes mellitus Elevated blood sugars likely due to current illness,  with no dietary changes reported. - Monitor blood sugar levels closely. Give insulin  correction when necessary.  Impacted cerumen, right ear Right ear impacted with dry cerumen causing itchiness and mild hearing difficulty. - Ear irrigation performed successfully today. - May use Debrox drops and bulb syringe with warm water periodically to avoid buildup of wax.  Patient Instructions  -Rest and stay well hydrated (by drinking water and other liquids). Avoid/limit caffeine. -Take over-the-counter medicines (i.e. Tylenol, Coricidin cold medicine) to help relieve your symptoms. Limit Ibuprofen use given your kidney disease. -For your sore throat, use cough drops/throat lozenges, gargle warm salt water and/or drink warm liquids (like tea). -Call, schedule return visit or seek care with your primary care/urgent care as needed for new/worsening symptoms (i.e. persistent fever, worsening throat pain) or if symptoms do not improve as discussed with recommended treatment over the next 5-7 days.  -Consider using Debrox ear drops and a bulb syringe with warm water occasionally to clean your ears. Avoid using Q-tips inside the ear canal.       General Counseling: Sumayyah verbalizes understanding of the findings of todays visit and plan of treatment. she has been encouraged to call the office with any questions or concerns that should arise related to todays visit.   Joen Arts PA-C Physician Assistant

## 2024-09-25 NOTE — Patient Instructions (Addendum)
-  Rest and stay well hydrated (by drinking water and other liquids). Avoid/limit caffeine. -Take over-the-counter medicines (i.e. Tylenol, Coricidin cold medicine) to help relieve your symptoms. Limit Ibuprofen use given your kidney disease. -For your sore throat, use cough drops/throat lozenges, gargle warm salt water and/or drink warm liquids (like tea). -Call, schedule return visit or seek care with your primary care/urgent care as needed for new/worsening symptoms (i.e. persistent fever, worsening throat pain) or if symptoms do not improve as discussed with recommended treatment over the next 5-7 days.  -Consider using Debrox ear drops and a bulb syringe with warm water occasionally to clean your ears. Avoid using Q-tips inside the ear canal.

## 2024-10-04 ENCOUNTER — Other Ambulatory Visit: Payer: Self-pay | Admitting: Nurse Practitioner

## 2024-10-04 DIAGNOSIS — L509 Urticaria, unspecified: Secondary | ICD-10-CM

## 2024-10-06 NOTE — Telephone Encounter (Signed)
 Requested medication (s) are due for refill today: yes  Requested medication (s) are on the active medication list: yes  Last refill:  07/07/24  Future visit scheduled: yes  Notes to clinic:  Unable to refill per protocol, appointment needed.      Requested Prescriptions  Pending Prescriptions Disp Refills   hydrOXYzine  (ATARAX ) 10 MG tablet [Pharmacy Med Name: HYDROXYZINE  HCL 10 MG TABLET] 90 tablet 0    Sig: TAKE 1 TABLET BY MOUTH EVERYDAY AT BEDTIME     Ear, Nose, and Throat:  Antihistamines 2 Failed - 10/06/2024  2:29 PM      Failed - Cr in normal range and within 360 days    Creatinine, Ser  Date Value Ref Range Status  01/05/2023 1.29 (H) 0.57 - 1.00 mg/dL Final         Failed - Valid encounter within last 12 months    Recent Outpatient Visits   None     Future Appointments             In 1 week [name], [name] [name], FNP [facility], [place]

## 2024-10-16 ENCOUNTER — Encounter: Payer: Self-pay | Admitting: Nurse Practitioner

## 2024-10-16 ENCOUNTER — Ambulatory Visit: Payer: Self-pay | Admitting: Nurse Practitioner

## 2024-10-16 VITALS — BP 130/78 | HR 51 | Temp 98.5°F | Ht 64.5 in | Wt 164.0 lb

## 2024-10-16 DIAGNOSIS — Z1159 Encounter for screening for other viral diseases: Secondary | ICD-10-CM | POA: Diagnosis not present

## 2024-10-16 DIAGNOSIS — D508 Other iron deficiency anemias: Secondary | ICD-10-CM

## 2024-10-16 DIAGNOSIS — E1040 Type 1 diabetes mellitus with diabetic neuropathy, unspecified: Secondary | ICD-10-CM | POA: Diagnosis not present

## 2024-10-16 DIAGNOSIS — E78019 Familial hypercholesterolemia, unspecified: Secondary | ICD-10-CM | POA: Diagnosis not present

## 2024-10-16 DIAGNOSIS — E039 Hypothyroidism, unspecified: Secondary | ICD-10-CM | POA: Diagnosis not present

## 2024-10-16 DIAGNOSIS — R053 Chronic cough: Secondary | ICD-10-CM

## 2024-10-16 DIAGNOSIS — F419 Anxiety disorder, unspecified: Secondary | ICD-10-CM | POA: Diagnosis not present

## 2024-10-16 DIAGNOSIS — N1831 Chronic kidney disease, stage 3a: Secondary | ICD-10-CM

## 2024-10-16 DIAGNOSIS — Z114 Encounter for screening for human immunodeficiency virus [HIV]: Secondary | ICD-10-CM

## 2024-10-16 DIAGNOSIS — Z0000 Encounter for general adult medical examination without abnormal findings: Secondary | ICD-10-CM | POA: Diagnosis not present

## 2024-10-16 DIAGNOSIS — F331 Major depressive disorder, recurrent, moderate: Secondary | ICD-10-CM | POA: Insufficient documentation

## 2024-10-16 MED ORDER — SERTRALINE HCL 25 MG PO TABS: 25.0000 mg | ORAL_TABLET | Freq: Every day | ORAL | 0 refills | Status: DC

## 2024-10-16 NOTE — Progress Notes (Signed)
 Name: Israa Caban   MRN: [id number]    DOB: [birth year]   Date:10/16/2024       Progress Note  Subjective  Chief Complaint  Chief Complaint  Patient presents with   Annual Exam    Pt c/o lasting cough from recent flu.     HPI  Patient presents for annual CPE. Discussed the use of AI scribe software for clinical note transcription with the patient, who gave verbal consent to proceed.  History of Present Illness Dannilynn Levandoski is a 28 year old female with type one diabetes who presents for an annual physical exam.  Respiratory symptoms - Persistent cough since November 24th following influenza infection - Cough is lingering with heavy congestion, non-productive - History of pneumonia without recall of specific symptoms from prior episodes - Currently taking DayQuil for cough management  Diabetes mellitus - Type 1 diabetes managed with insulin  pump and Tresiba 36 units daily - Novolog administered via insulin  pump three times daily - Also on Ozempic 1 mg weekly - Expresses exhaustion and difficulty managing diabetes, describing it as taking significant mental effort - Last hemoglobin A1c was 6.8 - Interested in checking A1c during this visit  Chronic kidney disease - Chronic kidney disease stage 3 - Followed by nephrology - No specific issues related to kidney disease discussed during this visit  Hypothyroidism - Hypothyroidism managed with levothyroxine 88 mcg daily - Requests thyroid  function tests for monitoring  Iron  deficiency anemia and hyperlipidemia - Iron  deficiency anemia - Hyperlipidemia managed with rosuvastatin 5 mg daily - Last LDL was 117 on January 18, 2024  Mental health symptoms - History of anxiety and depression - Depression screen score of 12, increased from 10 last year - Previously prescribed Xanax for exam-related anxiety - Uses hydroxyzine  for hives and sleep - Concerned about impact of mental health medications on sexual function  Lifestyle  and preventive health - On Ozempic, which encourages adherence to a well-balanced diet, avoiding fried foods and meat - 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 Estelle in [place]    Diet: well balanced diet Exercise: walks daily, working on running Sleep: 6-8 hours Last dental exam:this year late summer Last eye exam: this year, early this summer  Flowsheet Row Office Visit from 10/16/2024 in [facility]  AUDIT-C Score 1   Depression: Phq 9 is  positive    10/16/2024    3:09 PM 10/15/2023    3:43 PM 09/22/2023    8:35 AM 04/15/2023    9:35 AM 11/27/2021    8:15 AM  Depression screen PHQ 2/9  Decreased Interest 1 1 1  0 0  Down, Depressed, Hopeless 3 1 1  0   PHQ - 2 Score 4 2 2  0 0  Altered sleeping 2 3 1     Tired, decreased energy 3 3 1     Change in appetite 1 0 0    Feeling bad or failure about yourself  1 0 0    Trouble concentrating 0 1 0    Moving slowly or fidgety/restless 1 1 0    Suicidal thoughts 0 0 0    PHQ-9 Score 12 10  4      Difficult doing work/chores Very  difficult Somewhat difficult Not difficult at all       Data saved with a previous flowsheet row definition      10/16/2024    3:10 PM 10/15/2023    3:53 PM 09/22/2023    8:35 AM  GAD 7 : Generalized Anxiety Score  Nervous, Anxious, on Edge 2 1 1   Control/stop worrying 1 0 1  Worry too much - different things 1 0 1  Trouble relaxing 2 0 1  Restless 0 2 1  Easily annoyed or irritable 2 1 0  Afraid - awful might happen 2 0 0  Total GAD 7 Score 10 4 5   Anxiety Difficulty Very difficult Somewhat difficult Not difficult at all     Hypertension: BP Readings from Last 3 Encounters:  10/16/24 130/78   09/25/24 112/78  07/27/24 115/75   Obesity: Wt Readings from Last 3 Encounters:  10/16/24 164 lb (74.4 kg)  07/27/24 169 lb 1.6 oz (76.7 kg)  02/01/24 166 lb (75.3 kg)   BMI Readings from Last 3 Encounters:  10/16/24 27.72 kg/m  07/27/24 28.58 kg/m  02/01/24 28.05 kg/m    [employer] Visit from 10/16/2024 in [facility]  1 34 inches     Vaccines:  HPV: up to at age 50 , ask insurance if age between 54-45  Shingrix: 87-64 yo and ask insurance if covered when patient above 24 yo Pneumonia:  educated and discussed with patient. Flu:  educated and discussed with patient.  Hep C Screening: due STD testing and prevention (HIV/chl/gon/syphilis): due Intimate partner violence:none Sexual History : sexually active Menstrual History/LMP/Abnormal Bleeding: [facility]:IUD Incontinence Symptoms: none  Breast cancer:  - Last Mammogram: does not qualify - BRCA gene screening: none  Osteoporosis: Discussed high calcium and vitamin D supplementation, weight bearing exercises  Cervical cancer screening: 11/27/2021, will do with GYN  Skin cancer: Discussed monitoring for atypical lesions  Colorectal cancer: does not qualify   Lung cancer:   Low Dose CT Chest recommended if Age 10-80 years, 20 pack-year currently smoking OR have quit w/in 15years. Patient does not qualify.   ECG: none  Advanced Care Planning: A voluntary discussion about advance care planning including the explanation and discussion of advance directives.  Discussed health care proxy and Living will, and the patient was able to identify a health care proxy as [place].  Patient does not have a living will at present time. If patient does have living will, I have requested they bring this to the clinic to be scanned in to their chart.  Lipids: Lab Results  Component Value Date   CHOL 222 (H) 12/25/2022   CHOL 194 12/30/2021   Lab Results  Component Value Date   HDL 73 12/25/2022   HDL  55 12/30/2021   Lab Results  Component Value Date   LDLCALC 134 (H) 12/25/2022   LDLCALC 121 (H) 12/30/2021   Lab Results  Component Value Date   TRIG 86 12/25/2022   TRIG 99 12/30/2021   Lab Results  Component Value Date   CHOLHDL 3.0 12/25/2022   CHOLHDL 3.5 12/30/2021   No results found for: LDLDIRECT  Glucose: Glucose  Date Value Ref Range Status  01/05/2023 95 70 - 99 mg/dL Final  97/76/7975 876 (H) 70 - 99 mg/dL Final  97/71/7976 877 (H) 70 - 99 mg/dL Final    Patient Active Problem List   Diagnosis Date Noted   Familial hypercholesterolemia 10/16/2024   Carpal tunnel syndrome of right wrist  08/10/2023   Syncopal seizure (HCC) 08/10/2023   Hypothyroidism 04/15/2023   Hives 04/15/2023   Stage 3a chronic kidney disease (HCC) 04/15/2023   Sickle cell trait 11/25/2021   RBC microcytosis 05/16/2021   Iron  deficiency anemia 05/16/2021   IUD (intrauterine device) in place 08/28/2019   Insulin  pump status 01/13/2019   Graves disease 12/14/2018   Herpes simplex infection 10/29/2017   Anemia 06/05/2014   Type 1 diabetes mellitus (HCC) 06/05/2014    Past Surgical History:  Procedure Laterality Date   COLONOSCOPY  2004   WISDOM TOOTH EXTRACTION  2019    Family History  Problem Relation Age of Onset   Hypertension Father    Breast cancer Maternal Grandmother    Glaucoma Maternal Grandfather    Prostate cancer Maternal Grandfather    Hypertension Paternal Grandfather     Social History   Socioeconomic History   Marital status: Married    Spouse name: Not on file   Number of children: Not on file   Years of education: Not on file   Highest education level: Not on file  Occupational History   Not on file  Tobacco Use   Smoking status: Never   Smokeless tobacco: Never  Vaping Use   Vaping status: Never Used  Substance and Sexual Activity   Alcohol use: Yes    Comment: occasionaly   Drug use: Never   Sexual activity: Yes    Birth  control/protection: I.U.D.    Comment: Mirena  01/05/24  Other Topics Concern   Not on file  Social History Narrative   ** Merged History Encounter **       Social Drivers of Health   Tobacco Use: Low Risk (10/16/2024)   Patient History    Smoking Tobacco Use: Never    Smokeless Tobacco Use: Never    Passive Exposure: Not on file  Financial Resource Strain: Low Risk (10/16/2024)   Overall Financial Resource Strain (CARDIA)    Difficulty of Paying Living Expenses: Not hard at all  Food Insecurity: No Food Insecurity (10/16/2024)   Epic    Worried About Programme Researcher, Broadcasting/film/video in the Last Year: Never true    Ran Out of Food in the Last Year: Never true  Transportation Needs: No Transportation Needs (10/16/2024)   Epic    Lack of Transportation (Medical): No    Lack of Transportation (Non-Medical): No  Physical Activity: Sufficiently Active (10/16/2024)   Exercise Vital Sign    Days of Exercise per Week: 5 days    Minutes of Exercise per Session: 30 min  Stress: Stress Concern Present (10/15/2023)   [employer] of Occupational Health - Occupational Stress Questionnaire    Feeling of Stress : Rather much  Social Connections: Moderately Integrated (10/16/2024)   Social Connection and Isolation Panel    Frequency of Communication with Friends and Family: More than three times a week    Frequency of Social Gatherings with Friends and Family: More than three times a week    Attends Religious Services: Never    Database Administrator or Organizations: Yes    Attends Banker Meetings: 1 to 4 times per year    Marital Status: Married  Catering Manager Violence: Not At Risk (10/16/2024)   Epic    Fear of Current or Ex-Partner: No    Emotionally Abused: No    Physically Abused: No    Sexually Abused: No  Depression (PHQ2-9): High Risk (10/16/2024)   Depression (PHQ2-9)  PHQ-2 Score: 12  Alcohol Screen: Low Risk (10/16/2024)   Alcohol Screen    Last Alcohol  Screening Score (AUDIT): 1  Housing: Unknown (10/16/2024)   Epic    Unable to Pay for Housing in the Last Year: No    Number of Times Moved in the Last Year: Not on file    Homeless in the Last Year: No  Utilities: Not At Risk (10/16/2024)   Epic    Threatened with loss of utilities: No  Health Literacy: Adequate Health Literacy (10/15/2023)   B1300 Health Literacy    Frequency of need for help with medical instructions: Rarely    Current Medications[1]  Allergies[2]   ROS  Constitutional: Negative for fever or weight change.  Respiratory: Negative for cough and shortness of breath.   Cardiovascular: Negative for chest pain or palpitations.  Gastrointestinal: Negative for abdominal pain, no bowel changes.  Musculoskeletal: Negative for gait problem or joint swelling.  Skin: Negative for rash.  Neurological: Negative for dizziness or headache.  No other specific complaints in a complete review of systems (except as listed in HPI above).   Objective  Vitals:   10/16/24 1444  BP: 130/78  Pulse: (!) 51  Temp: 98.5 F (36.9 C)  Weight: 164 lb (74.4 kg)  Height: 5' 4.5 (1.638 m)    Body mass index is 27.72 kg/m.  Physical Exam Vitals reviewed.  Constitutional:      Appearance: Normal appearance.  HENT:     Head: Normocephalic.     Right Ear: Tympanic membrane normal.     Left Ear: Tympanic membrane normal.     Nose: Nose normal.  Eyes:     Extraocular Movements: Extraocular movements intact.     Conjunctiva/sclera: Conjunctivae normal.     Pupils: Pupils are equal, round, and reactive to light.  Neck:     Thyroid : No thyroid  mass, thyromegaly or thyroid  tenderness.  Cardiovascular:     Rate and Rhythm: Normal rate and regular rhythm.     Pulses: Normal pulses.     Heart sounds: Normal heart sounds.  Pulmonary:     Effort: Pulmonary effort is normal.     Breath sounds: Normal breath sounds.  Abdominal:     General: Bowel sounds are normal.      Palpations: Abdomen is soft.  Musculoskeletal:        General: Normal range of motion.     Cervical back: Normal range of motion and neck supple.     Right lower leg: No edema.     Left lower leg: No edema.  Skin:    General: Skin is warm and dry.     Capillary Refill: Capillary refill takes less than 2 seconds.  Neurological:     General: No focal deficit present.     Mental Status: She is alert and oriented to person, place, and time. Mental status is at baseline.  Psychiatric:        Mood and Affect: Mood normal.        Behavior: Behavior normal.        Thought Content: Thought content normal.        Judgment: Judgment normal.      Recent Results (from the past 2160 hours)  Herpes simplex virus culture     Status: None   Collection Time: 07/27/24 11:00 AM   Specimen: Other   VA  Result Value Ref Range   HSV Culture/Type Comment     Comment: Negative No Herpes simplex  virus isolated.   POCT rapid strep A     Status: Normal   Collection Time: 09/25/24  9:32 AM  Result Value Ref Range   Rapid Strep A Screen Negative Negative    Diabetic Foot Exam: Diabetic Foot Exam - Simple   Simple Foot Form Diabetic Foot exam was performed with the following findings: Yes 10/16/2024  3:08 PM  Visual Inspection No deformities, no ulcerations, no other skin breakdown bilaterally: Yes Sensation Testing Intact to touch and monofilament testing bilaterally: Yes Pulse Check Posterior Tibialis and Dorsalis pulse intact bilaterally: Yes Comments      Fall Risk:    10/15/2023    3:53 PM 09/22/2023    8:34 AM 04/15/2023    9:35 AM 11/27/2021    8:15 AM  Fall Risk   Falls in the past year? 1 1 0 0  Number falls in past yr: 1 1 0 0  Injury with Fall? 1  1  0  0   Risk for fall due to : History of fall(s) History of fall(s)    Follow up Falls prevention discussed;Education provided;Falls evaluation completed Falls prevention discussed       Data saved with a previous flowsheet  row definition     Functional Status Survey: Is the patient deaf or have difficulty hearing?: No Does the patient have difficulty seeing, even when wearing glasses/contacts?: No Does the patient have difficulty concentrating, remembering, or making decisions?: No Does the patient have difficulty walking or climbing stairs?: No Does the patient have difficulty dressing or bathing?: No Does the patient have difficulty doing errands alone such as visiting a doctor's office or shopping?: No   Assessment & Plan  Problem List Items Addressed This Visit       Endocrine   Type 1 diabetes mellitus (HCC)   Relevant Orders   Comprehensive metabolic panel with GFR   Microalbumin / creatinine urine ratio   Hemoglobin A1c   HM Diabetes Foot Exam (Completed)   Hypothyroidism   Relevant Orders   TSH     Genitourinary   Stage 3a chronic kidney disease (HCC)   Relevant Orders   Comprehensive metabolic panel with GFR     Other   Iron  deficiency anemia (Chronic)   Relevant Orders   CBC with Differential/Platelet   Familial hypercholesterolemia   Relevant Orders   Lipid panel   Other Visit Diagnoses       Annual physical exam    -  Primary   Relevant Orders   CBC with Differential/Platelet   Comprehensive metabolic panel with GFR   Microalbumin / creatinine urine ratio   Hemoglobin A1c   Hepatitis C antibody   HIV Antibody (routine testing w rflx)   TSH   Lipid panel   HM Diabetes Foot Exam (Completed)     Encounter for hepatitis C screening test for low risk patient       Relevant Orders   Hepatitis C antibody     Screening for HIV without presence of risk factors       Relevant Orders   HIV Antibody (routine testing w rflx)     Persistent cough          Assessment and Plan Assessment & Plan Type 1 diabetes mellitus with diabetic neuropathy Managed with Ozempic, Tresiba, and Novolog via insulin  pump. Reports feeling overwhelmed and less dedicated to diabetes management,  impacting blood sugar control. Acknowledges need to improve management. - Checked A1c level  Stage 3a chronic kidney  disease Managed by nephrology.  Hypothyroidism Managed with levothyroxine. - Checked thyroid  function tests  Familial hypercholesterolemia Managed with rosuvastatin. Last LDL was 117 mg/dL in March 7974.  Iron  deficiency anemia -recheck cpc  Persistent cough Since November 24th, following a flu-like illness. Described as lingering and non-productive with heaviness. No pneumonia in recent years. Lung examination normal. - Recommended plain Mucinex twice daily for 10 days - Advised to drink plenty of water - Instructed to report if symptoms persist  Major depressive disorder, recurrent, moderate Depression screen increased from 10 to 12. Discussed potential benefits of SSRIs, specifically Zoloft , due to lower side effect profile and calming effects. Concerns about sexual dysfunction as a side effect were addressed. Agreed to trial Zoloft  with understanding of potential side effects and need for follow-up. - Started Zoloft  at the lowest dose - Scheduled follow-up in 4 weeks to evaluate response and adjust treatment if necessary  Anxiety disorder Anxiety screen increased from 4 to 10. Discussed potential benefits of Zoloft  for anxiety management. Concerns about medication interactions and side effects were addressed. Agreed to trial Zoloft  with understanding of potential side effects and need for follow-up. - Started Zoloft  at the lowest dose - Scheduled follow-up in 4 weeks to evaluate response and adjust treatment if necessary  General Health Maintenance Routine health maintenance discussed. Blood pressure and weight are stable. Recent dental and eye exams completed. PAP smear due next year. Discussed living will and advance directives. - Continue routine health maintenance - Plan for PAP smear next year - Consider creating a living will    -USPSTF grade A and B  recommendations reviewed with patient; age-appropriate recommendations, preventive care, screening tests, etc discussed and encouraged; healthy living encouraged; see AVS for patient education given to patient -Discussed importance of 150 minutes of physical activity weekly, eat two servings of fish weekly, eat one serving of tree nuts ( cashews, pistachios, pecans, almonds.SABRA) every other day, eat 6 servings of fruit/vegetables daily and drink plenty of water and avoid sweet beverages.   -Reviewed Health Maintenance: yes     [1]  Current Outpatient Medications:    Cholecalciferol 50 MCG (2000 UT) TABS, Take by mouth daily., Disp: , Rfl:    Continuous Glucose Sensor (DEXCOM G6 SENSOR) MISC, SMARTSIG:Topical Every 10 Days, Disp: , Rfl:    Cyanocobalamin  (B-12 PO), Take by mouth daily., Disp: , Rfl:    famotidine  (PEPCID ) 20 MG tablet, TAKE 1 TABLET BY MOUTH TWICE A DAY, Disp: 180 tablet, Rfl: 0   gabapentin (NEURONTIN) 300 MG capsule, Take 1 capsule by mouth 3 (three) times daily., Disp: , Rfl:    glucagon 1 MG injection, as directed., Disp: , Rfl:    hydrOXYzine  (ATARAX ) 10 MG tablet, TAKE 1 TABLET BY MOUTH EVERYDAY AT BEDTIME, Disp: 30 tablet, Rfl: 0   Insulin  Disposable Pump (OMNIPOD 5 DEXG7G6 PODS GEN 5) MISC, SMARTSIG:1 Each SUB-Q Every Other Day, Disp: , Rfl:    Insulin  Disposable Pump (OMNIPOD 5 G6 INTRO, GEN 5,) KIT, Inject into the skin., Disp: , Rfl:    levonorgestrel  (MIRENA ) 20 MCG/DAY IUD, 1 each by Intrauterine route once., Disp: , Rfl:    MAGNESIUM CITRATE PO, Take by mouth daily., Disp: , Rfl:    NOVOLOG 100 UNIT/ML injection, FOR USE IN INSULIN  PUMP. USE AT LEAST 3 TIMES A DAY. USE UP TO 100 UNITS PER DAY., Disp: , Rfl:    ondansetron  (ZOFRAN ) 8 MG tablet, Take 1 tablet (8 mg total) by mouth every 8 (eight) hours as  needed for nausea or vomiting., Disp: 20 tablet, Rfl: 0   OZEMPIC, 1 MG/DOSE, 4 MG/3ML SOPN, Inject 1 mg into the skin once a week., Disp: , Rfl:    primidone  (MYSOLINE) 50 MG tablet, Take 25 mg by mouth 4 (four) times daily., Disp: , Rfl:    QULIPTA 60 MG TABS, Take 60 mg by mouth daily., Disp: , Rfl:    rosuvastatin (CRESTOR) 5 MG tablet, Take 5 mg by mouth daily., Disp: , Rfl:    SUMAtriptan (IMITREX) 50 MG tablet, as needed., Disp: , Rfl:    valACYclovir  (VALTREX ) 500 MG tablet, TAKE 1 TABLET (500 MG TOTAL) BY MOUTH DAILY., Disp: 90 tablet, Rfl: 0 [2]  Allergies Allergen Reactions   Tuberculin Ppd Itching and Rash    2013/2014   Tuberculin Tests Rash    2013/2014   Nitrofurantoin  Hives   Fluconazole  Hives   Naproxen Itching and Rash   Naproxen Sodium Rash   Other Rash    TB SKIN TEST  TB SKIN TEST  TB SKIN TEST   Tuberculin Ppd Itching

## 2024-10-17 LAB — COMPREHENSIVE METABOLIC PANEL WITH GFR
AG Ratio: 1.3 (calc) (ref 1.0–2.5)
ALT: 13 U/L (ref 6–29)
AST: 14 U/L (ref 10–30)
Albumin: 4 g/dL (ref 3.6–5.1)
Alkaline phosphatase (APISO): 47 U/L (ref 31–125)
BUN/Creatinine Ratio: 10 (calc) (ref 6–22)
BUN: 17 mg/dL (ref 7–25)
CO2: 25 mmol/L (ref 20–32)
Calcium: 9.7 mg/dL (ref 8.6–10.2)
Chloride: 108 mmol/L (ref 98–110)
Creat: 1.66 mg/dL — ABNORMAL HIGH (ref 0.50–0.97)
Globulin: 3.2 g/dL (ref 1.9–3.7)
Glucose, Bld: 149 mg/dL — ABNORMAL HIGH (ref 65–99)
Potassium: 4.8 mmol/L (ref 3.5–5.3)
Sodium: 142 mmol/L (ref 135–146)
Total Bilirubin: 0.4 mg/dL (ref 0.2–1.2)
Total Protein: 7.2 g/dL (ref 6.1–8.1)
eGFR: 42 mL/min/1.73m2 — ABNORMAL LOW (ref >=60)

## 2024-10-17 LAB — LIPID PANEL
Cholesterol: 183 mg/dL (ref <200)
HDL: 81 mg/dL (ref >=50)
LDL Cholesterol (Calc): 84 mg/dL
Non-HDL Cholesterol (Calc): 102 mg/dL (ref <130)
Total CHOL/HDL Ratio: 2.3 (calc) (ref <5.0)
Triglycerides: 85 mg/dL (ref <150)

## 2024-10-17 LAB — CBC WITH DIFFERENTIAL/PLATELET
Absolute Lymphocytes: 2136 {cells}/uL (ref 850–3900)
Absolute Monocytes: 543 {cells}/uL (ref 200–950)
Basophils Absolute: 53 {cells}/uL (ref 0–200)
Basophils Relative: 0.9 %
Eosinophils Absolute: 153 {cells}/uL (ref 15–500)
Eosinophils Relative: 2.6 %
HCT: 34.5 % — ABNORMAL LOW (ref 35.9–46.0)
Hemoglobin: 11.2 g/dL — ABNORMAL LOW (ref 11.7–15.5)
MCH: 28.4 pg (ref 27.0–33.0)
MCHC: 32.5 g/dL (ref 31.6–35.4)
MCV: 87.3 fL (ref 81.4–101.7)
MPV: 10.8 fL (ref 7.5–12.5)
Monocytes Relative: 9.2 %
Neutro Abs: 3015 {cells}/uL (ref 1500–7800)
Neutrophils Relative %: 51.1 %
Platelets: 269 Thousand/uL (ref 140–400)
RBC: 3.95 Million/uL (ref 3.80–5.10)
RDW: 12.8 % (ref 11.0–15.0)
Total Lymphocyte: 36.2 %
WBC: 5.9 Thousand/uL (ref 3.8–10.8)

## 2024-10-17 LAB — MICROALBUMIN / CREATININE URINE RATIO
Creatinine, Urine: 161 mg/dL (ref 20–275)
Microalb Creat Ratio: 12 mg/g{creat} (ref <30)
Microalb, Ur: 2 mg/dL

## 2024-10-17 LAB — HEMOGLOBIN A1C
Hgb A1c MFr Bld: 6.5 % — ABNORMAL HIGH (ref <5.7)
Mean Plasma Glucose: 140 mg/dL
eAG (mmol/L): 7.7 mmol/L

## 2024-10-17 LAB — HIV ANTIBODY (ROUTINE TESTING W REFLEX)
HIV 1&2 Ab, 4th Generation: NONREACTIVE
HIV FINAL INTERPRETATION: NEGATIVE

## 2024-10-17 LAB — TSH: TSH: 3.2 m[IU]/L

## 2024-10-17 LAB — HEPATITIS C ANTIBODY: Hepatitis C Ab: NONREACTIVE

## 2024-10-18 ENCOUNTER — Ambulatory Visit: Payer: Self-pay | Admitting: Nurse Practitioner

## 2024-10-26 ENCOUNTER — Other Ambulatory Visit: Payer: Self-pay | Admitting: Nurse Practitioner

## 2024-10-26 DIAGNOSIS — L509 Urticaria, unspecified: Secondary | ICD-10-CM

## 2024-10-27 NOTE — Telephone Encounter (Signed)
 Requested Prescriptions  Pending Prescriptions Disp Refills   famotidine  (PEPCID ) 20 MG tablet [Pharmacy Med Name: FAMOTIDINE  20 MG TABLET] 180 tablet 0    Sig: TAKE 1 TABLET BY MOUTH TWICE A DAY     Gastroenterology:  H2 Antagonists Passed - 10/27/2024  3:02 PM      Passed - Valid encounter within last 12 months    Recent Outpatient Visits           1 week ago Annual physical exam   [facility] [name] [name] [name], [place]

## 2024-11-07 ENCOUNTER — Other Ambulatory Visit: Payer: Self-pay | Admitting: Nurse Practitioner

## 2024-11-07 DIAGNOSIS — F419 Anxiety disorder, unspecified: Secondary | ICD-10-CM

## 2024-11-07 DIAGNOSIS — L509 Urticaria, unspecified: Secondary | ICD-10-CM

## 2024-11-07 DIAGNOSIS — F331 Major depressive disorder, recurrent, moderate: Secondary | ICD-10-CM

## 2024-11-07 DIAGNOSIS — E039 Hypothyroidism, unspecified: Secondary | ICD-10-CM

## 2024-11-08 NOTE — Telephone Encounter (Signed)
 Requested medication (s) are due for refill today: Yes  Requested medication (s) are on the active medication list: Yes  Last refill:  10/06/24  Future visit scheduled: Yes  Notes to clinic:  See pharmacy requests.    Requested Prescriptions  Pending Prescriptions Disp Refills   hydrOXYzine  (ATARAX ) 10 MG tablet [Pharmacy Med Name: HYDROXYZINE  HCL 10 MG TABLET] 90 tablet 1    Sig: TAKE 1 TABLET BY MOUTH EVERYDAY AT BEDTIME     Ear, Nose, and Throat:  Antihistamines 2 Failed - 11/08/2024  3:44 PM      Failed - Cr in normal range and within 360 days    Creat  Date Value Ref Range Status  10/16/2024 1.66 (H) 0.50 - 0.97 mg/dL Final   Creatinine, Urine  Date Value Ref Range Status  10/16/2024 161 20 - 275 mg/dL Final         Passed - Valid encounter within last 12 months    Recent Outpatient Visits           3 weeks ago Annual physical exam   [facility] [name] [name] [name], FNP               sertraline  (ZOLOFT ) 25 MG tablet [Pharmacy Med Name: SERTRALINE  HCL 25 MG TABLET] 90 tablet 1    Sig: Take 1 tablet (25 mg total) by mouth daily.     Psychiatry:  Antidepressants - SSRI - sertraline  Passed - 11/08/2024  3:44 PM      Passed - AST in normal range and within 360 days    AST  Date Value Ref Range Status  10/16/2024 14 10 - 30 U/L Final         Passed - ALT in normal range and within 360 days    ALT  Date Value Ref Range Status  10/16/2024 13 6 - 29 U/L Final         Passed - Completed PHQ-2 or PHQ-9 in the last 360 days      Passed - Valid encounter within last 6 months    Recent Outpatient Visits           3 weeks ago Annual physical exam   [facility] [name] [name] [name], FNP

## 2024-11-11 ENCOUNTER — Other Ambulatory Visit: Payer: Self-pay | Admitting: Nurse Practitioner

## 2024-11-11 DIAGNOSIS — L509 Urticaria, unspecified: Secondary | ICD-10-CM

## 2024-11-13 ENCOUNTER — Ambulatory Visit: Admitting: Nurse Practitioner

## 2024-11-13 ENCOUNTER — Encounter: Payer: Self-pay | Admitting: Nurse Practitioner

## 2024-11-13 VITALS — BP 132/88 | HR 71 | Temp 97.3°F | Ht 64.5 in | Wt 164.0 lb

## 2024-11-13 DIAGNOSIS — F331 Major depressive disorder, recurrent, moderate: Secondary | ICD-10-CM | POA: Diagnosis not present

## 2024-11-13 DIAGNOSIS — F419 Anxiety disorder, unspecified: Secondary | ICD-10-CM | POA: Diagnosis not present

## 2024-11-13 DIAGNOSIS — E039 Hypothyroidism, unspecified: Secondary | ICD-10-CM | POA: Diagnosis not present

## 2024-11-13 MED ORDER — SERTRALINE HCL 25 MG PO TABS: 25.0000 mg | ORAL_TABLET | Freq: Every day | ORAL | 0 refills | Status: DC

## 2024-11-13 MED ORDER — DULOXETINE HCL 20 MG PO CPEP: 20.0000 mg | ORAL_CAPSULE | Freq: Every day | ORAL | 0 refills | Status: DC

## 2024-11-13 NOTE — Telephone Encounter (Signed)
 Requested Prescriptions  Pending Prescriptions Disp Refills   hydrOXYzine  (ATARAX ) 10 MG tablet [Pharmacy Med Name: HYDROXYZINE  HCL 10 MG TABLET] 90 tablet 1    Sig: TAKE 1 TABLET BY MOUTH EVERYDAY AT BEDTIME     Ear, Nose, and Throat:  Antihistamines 2 Failed - 11/13/2024  2:57 PM      Failed - Cr in normal range and within 360 days    Creat  Date Value Ref Range Status  10/16/2024 1.66 (H) 0.50 - 0.97 mg/dL Final   Creatinine, Urine  Date Value Ref Range Status  10/16/2024 161 20 - 275 mg/dL Final         Passed - Valid encounter within last 12 months    Recent Outpatient Visits           Today Moderate episode of recurrent major depressive disorder [facility])   [facility] [name] [name] [name], FNP   4 weeks ago Annual physical exam   [facility] [name] [name] [name], [place]

## 2024-11-13 NOTE — Progress Notes (Addendum)
 "  BP 132/88   Pulse 71   Temp (!) 97.3 F (36.3 C)   Ht 5' 4.5 (1.638 m)   Wt 164 lb (74.4 kg)   SpO2 97%   BMI 27.72 kg/m    Subjective:    Patient ID: Patricia Ramirez, female    DOB: [birth year], 29 y.o.   MRN: [id number]  HPI: Patricia Ramirez is a 29 y.o. female  Chief Complaint  Patient presents with   Medical Management of Chronic Issues   Discussed the use of AI scribe software for clinical note transcription with the patient, who gave verbal consent to proceed.  History of Present Illness Patricia Ramirez is a 29 year old female with chronic kidney disease stage 3B who presents for a four-week follow-up after starting Zoloft  for anxiety and depression.  Anxiety and depression - Started Zoloft  25 mg daily four weeks ago for anxiety and depression. - Zoloft  has been effective in reducing anxiety, especially in social situations. - No episodes of tachycardia during conversations. - Depression is more manageable and functionally coexists with daily life. - No negative impact on sexual function; increased interest and decreased preoccupation. - Experiences mild cognitive effects described as 'slightly spacier' but not lethargic. - Currently takes Zoloft  at night due to potential drowsiness.  Chronic pain - Chronic nondescript pain persists. - Stopped daily ibuprofen use due to concerns about kidney function. - Interested in exploring medication options that address both mental health and pain management.  Chronic kidney disease - Chronic kidney disease stage 3B. - Avoids NSAID use due to concerns about kidney function. - Concerned about kidney health and is monitored by a nephrologist (Dr. Sheppard).  Medication issues - Recent pharmacy rejection for hydroxyzine , suspected to be due to early refill request.         11/13/2024    3:14 PM 10/16/2024    3:09 PM 10/15/2023    3:43 PM  Depression screen PHQ 2/9  Decreased Interest 1 1 1   Down, Depressed, Hopeless 1 3 1   PHQ -  2 Score 2 4 2   Altered sleeping 0 2 3  Tired, decreased energy 1 3 3   Change in appetite 2 1 0  Feeling bad or failure about yourself  0 1 0  Trouble concentrating 1 0 1  Moving slowly or fidgety/restless 0 1 1  Suicidal thoughts 0 0 0  PHQ-9 Score 6 12 10    Difficult doing work/chores  Very difficult Somewhat difficult     Data saved with a previous flowsheet row definition       10/16/2024    3:10 PM 10/15/2023    3:53 PM 09/22/2023    8:35 AM  GAD 7 : Generalized Anxiety Score  Nervous, Anxious, on Edge 2 1 1   Control/stop worrying 1 0 1  Worry too much - different things 1 0 1  Trouble relaxing 2 0 1  Restless 0 2 1  Easily annoyed or irritable 2 1 0  Afraid - awful might happen 2 0 0  Total GAD 7 Score 10 4 5   Anxiety Difficulty Very difficult Somewhat difficult Not difficult at all     Relevant past medical, surgical, family and social history reviewed and updated as indicated. Interim medical history since our last visit reviewed. Allergies and medications reviewed and updated.  Review of Systems  Constitutional: Negative for fever or weight change.  Respiratory: Negative for cough and shortness of breath.   Cardiovascular: Negative for chest pain or palpitations.  Gastrointestinal:  Negative for abdominal pain, no bowel changes.  Musculoskeletal: Negative for gait problem or joint swelling.  Skin: Negative for rash.  Neurological: Negative for dizziness or headache.  No other specific complaints in a complete review of systems (except as listed in HPI above).      Objective:      BP 132/88   Pulse 71   Temp (!) 97.3 F (36.3 C)   Ht 5' 4.5 (1.638 m)   Wt 164 lb (74.4 kg)   SpO2 97%   BMI 27.72 kg/m    Wt Readings from Last 3 Encounters:  11/13/24 164 lb (74.4 kg)  10/16/24 164 lb (74.4 kg)  07/27/24 169 lb 1.6 oz (76.7 kg)    Physical Exam GENERAL: Alert, cooperative, well developed, no acute distress HEENT: Normocephalic, normal oropharynx,  moist mucous membranes CHEST: Clear to auscultation bilaterally, No wheezes, rhonchi, or crackles CARDIOVASCULAR: Normal heart rate and rhythm, S1 and S2 normal without murmurs ABDOMEN: Soft, non-tender, non-distended, without organomegaly, Normal bowel sounds EXTREMITIES: No cyanosis or edema NEUROLOGICAL: Cranial nerves grossly intact, Moves all extremities without gross motor or sensory deficit  Results for orders placed or performed in visit on 10/16/24  CBC with Differential/Platelet   Collection Time: 10/16/24  3:32 PM  Result Value Ref Range   WBC 5.9 3.8 - 10.8 Thousand/uL   RBC 3.95 3.80 - 5.10 Million/uL   Hemoglobin 11.2 (L) 11.7 - 15.5 g/dL   HCT 65.4 (L) 64.0 - 53.9 %   MCV 87.3 81.4 - 101.7 fL   MCH 28.4 27.0 - 33.0 pg   MCHC 32.5 31.6 - 35.4 g/dL   RDW 87.1 88.9 - 84.9 %   Platelets 269 140 - 400 Thousand/uL   MPV 10.8 7.5 - 12.5 fL   Neutro Abs 3,015 1,500 - 7,800 cells/uL   Absolute Lymphocytes 2,136 850 - 3,900 cells/uL   Absolute Monocytes 543 200 - 950 cells/uL   Eosinophils Absolute 153 15 - 500 cells/uL   Basophils Absolute 53 0 - 200 cells/uL   Neutrophils Relative % 51.1 %   Total Lymphocyte 36.2 %   Monocytes Relative 9.2 %   Eosinophils Relative 2.6 %   Basophils Relative 0.9 %  Comprehensive metabolic panel with GFR   Collection Time: 10/16/24  3:32 PM  Result Value Ref Range   Glucose, Bld 149 (H) 65 - 99 mg/dL   BUN 17 7 - 25 mg/dL   Creat 8.33 (H) 9.49 - 0.97 mg/dL   eGFR 42 (L) > OR = 60 mL/min/1.59m2   BUN/Creatinine Ratio 10 6 - 22 (calc)   Sodium 142 135 - 146 mmol/L   Potassium 4.8 3.5 - 5.3 mmol/L   Chloride 108 98 - 110 mmol/L   CO2 25 20 - 32 mmol/L   Calcium 9.7 8.6 - 10.2 mg/dL   Total Protein 7.2 6.1 - 8.1 g/dL   Albumin 4.0 3.6 - 5.1 g/dL   Globulin 3.2 1.9 - 3.7 g/dL (calc)   AG Ratio 1.3 1.0 - 2.5 (calc)   Total Bilirubin 0.4 0.2 - 1.2 mg/dL   Alkaline phosphatase (APISO) 47 31 - 125 U/L   AST 14 10 - 30 U/L   ALT 13 6 -  29 U/L  Microalbumin / creatinine urine ratio   Collection Time: 10/16/24  3:32 PM  Result Value Ref Range   Creatinine, Urine 161 20 - 275 mg/dL   Microalb, Ur 2.0 mg/dL   Microalb Creat Ratio 12 <30 mg/g creat  Hemoglobin  A1c   Collection Time: 10/16/24  3:32 PM  Result Value Ref Range   Hgb A1c MFr Bld 6.5 (H) <5.7 %   Mean Plasma Glucose 140 mg/dL   eAG (mmol/L) 7.7 mmol/L  Hepatitis C antibody   Collection Time: 10/16/24  3:32 PM  Result Value Ref Range   Hepatitis C Ab NON-REACTIVE NON-REACTIVE  HIV Antibody (routine testing w rflx)   Collection Time: 10/16/24  3:32 PM  Result Value Ref Range   HIV FINAL INTERPRETATION HIV NEGATIVE    HIV 1&2 Ab, 4th Generation NON-REACTIVE NON-REACTIVE  TSH   Collection Time: 10/16/24  3:32 PM  Result Value Ref Range   TSH 3.20 mIU/L  Lipid panel   Collection Time: 10/16/24  3:32 PM  Result Value Ref Range   Cholesterol 183 <200 mg/dL   HDL 81 > OR = 50 mg/dL   Triglycerides 85 <849 mg/dL   LDL Cholesterol (Calc) 84 mg/dL (calc)   Total CHOL/HDL Ratio 2.3 <5.0 (calc)   Non-HDL Cholesterol (Calc) 102 <130 mg/dL (calc)          Assessment & Plan:   Problem List Items Addressed This Visit       Endocrine   Hypothyroidism   Relevant Medications   sertraline  (ZOLOFT ) 25 MG tablet     Other   Anxiety   Relevant Medications   DULoxetine  (CYMBALTA ) 20 MG capsule   sertraline  (ZOLOFT ) 25 MG tablet   Moderate episode of recurrent major depressive disorder (HCC) - Primary   Relevant Medications   DULoxetine  (CYMBALTA ) 20 MG capsule   sertraline  (ZOLOFT ) 25 MG tablet     Assessment and Plan Assessment & Plan Major depressive disorder and anxiety Reports improvement in anxiety symptoms with Zoloft  25 mg daily, including reduced heart rate spikes and decreased anxiety in social situations. Depression symptoms persist but are more manageable. No negative impact on sexual function. Interested in Cymbalta  for potential nerve  pain relief and dual benefit for mental health. Discussed potential side effects of Cymbalta , including drowsiness, and the risk of serotonin syndrome if combined with high doses of Zoloft . Agreed to trial Cymbalta  while continuing Zoloft , with a plan to monitor for side effects and efficacy. - Continue Zoloft  25 mg daily. - Initiated Cymbalta  trial, instructed to monitor for side effects and efficacy. - Scheduled follow-up in 4 weeks to assess response to Cymbalta  and adjust treatment as needed.  Chronic kidney disease stage 3B GFR consistent with stage 3B. Advised to avoid daily NSAID use to prevent further kidney damage. Current management includes monitoring kidney function and avoiding nephrotoxic medications. Reassured that kidney function can fluctuate and may improve over time. Under the care of a nephrologist, Dr. Francina, for ongoing management. - Continue to avoid daily NSAID use; use Tylenol for pain management. - Continue follow-up with nephrologist Dr. Francina for ongoing kidney management.        Follow up plan: Return in about 4 weeks (around 12/11/2024) for follow up. "

## 2024-11-22 ENCOUNTER — Encounter: Payer: Self-pay | Admitting: Nurse Practitioner

## 2024-11-22 ENCOUNTER — Other Ambulatory Visit: Payer: Self-pay | Admitting: Nurse Practitioner

## 2024-11-22 DIAGNOSIS — R112 Nausea with vomiting, unspecified: Secondary | ICD-10-CM

## 2024-11-22 MED ORDER — ONDANSETRON HCL 8 MG PO TABS: 8.0000 mg | ORAL_TABLET | Freq: Three times a day (TID) | ORAL | 2 refills | Status: AC | PRN

## 2024-11-30 ENCOUNTER — Ambulatory Visit: Admitting: Adult Health

## 2024-11-30 ENCOUNTER — Other Ambulatory Visit: Payer: Self-pay

## 2024-11-30 VITALS — BP 120/80 | HR 91 | Temp 97.0°F | Ht 64.5 in | Wt 145.0 lb

## 2024-11-30 DIAGNOSIS — R051 Acute cough: Secondary | ICD-10-CM

## 2024-11-30 DIAGNOSIS — R110 Nausea: Secondary | ICD-10-CM

## 2024-11-30 DIAGNOSIS — T887XXA Unspecified adverse effect of drug or medicament, initial encounter: Secondary | ICD-10-CM

## 2024-11-30 MED ORDER — DOXYCYCLINE HYCLATE 100 MG PO TABS: 100.0000 mg | ORAL_TABLET | Freq: Two times a day (BID) | ORAL | 0 refills | Status: AC

## 2024-11-30 MED ORDER — ALBUTEROL SULFATE HFA 108 (90 BASE) MCG/ACT IN AERS: INHALATION_SPRAY | RESPIRATORY_TRACT | 0 refills | Status: AC

## 2024-11-30 MED ORDER — ONDANSETRON HCL 4 MG PO TABS: 4.0000 mg | ORAL_TABLET | Freq: Three times a day (TID) | ORAL | 1 refills | Status: AC | PRN

## 2024-11-30 NOTE — Progress Notes (Signed)
 Therapist, [facility] [address] [place], [place] [address]   Office Visit Note  Patient Name: Patricia Ramirez Date of Birth [id number]  Medical Record number [id number]  Date of Service: 11/30/2024  Chief Complaint  Patient presents with   Acute Visit    Patient states her husband was diagnosed with the flu on 1/19 and she became sick the following day. She is still feeling weak and has chest pain which she feels is possibly due to persistent coughing. She also reports SOB and wheezing. She also reports losing >10 lbs since getting sick.     HPI Pt is here for a sick visit.  She reports husband was diagnosed with flu 10 days ago and she became ill immediatly as well. However, she is still having chest pain, fatigue/weakness, muscle fatigue in legs, dizziness, nausea.  She also reports 12 pound weight loss. She repots significant cough, that wakes her up at night.  She reports a recent decline in kidney function, so she was told to avoid nsaids, and started on Cymbalta  two weeks ago.    Current Medication:  Outpatient Encounter Medications as of 11/30/2024  Medication Sig   albuterol  (VENTOLIN  HFA) 108 (90 Base) MCG/ACT inhaler Inhale 2 puffs three times daily for one week.  Then use 2 puffs every 4-6 hours as needed for cough   Cholecalciferol 50 MCG (2000 UT) TABS Take by mouth daily.   Continuous Glucose Sensor (DEXCOM G6 SENSOR) MISC SMARTSIG:Topical Every 10 Days   Cyanocobalamin  (B-12 PO) Take by mouth daily.   doxycycline  (VIBRA -TABS) 100 MG tablet Take 1 tablet (100 mg total) by mouth 2 (two) times daily.   DULoxetine  (CYMBALTA ) 20 MG capsule Take 1 capsule (20 mg total) by mouth daily.   famotidine  (PEPCID ) 20 MG tablet TAKE 1 TABLET BY MOUTH TWICE A DAY   gabapentin (NEURONTIN) 300 MG capsule Take 1 capsule by mouth 3 (three) times daily.   glucagon 1 MG injection as directed.   hydrOXYzine  (ATARAX ) 10 MG tablet TAKE 1 TABLET BY MOUTH EVERYDAY AT BEDTIME   Insulin  Disposable  Pump (OMNIPOD 5 DEXG7G6 PODS GEN 5) MISC SMARTSIG:1 Each SUB-Q Every Other Day   Insulin  Disposable Pump (OMNIPOD 5 G6 INTRO, GEN 5,) KIT Inject into the skin.   levonorgestrel  (MIRENA ) 20 MCG/DAY IUD 1 each by Intrauterine route once.   MAGNESIUM CITRATE PO Take by mouth daily.   NOVOLOG 100 UNIT/ML injection FOR USE IN INSULIN  PUMP. USE AT LEAST 3 TIMES A DAY. USE UP TO 100 UNITS PER DAY.   ondansetron  (ZOFRAN ) 4 MG tablet Take 1 tablet (4 mg total) by mouth every 8 (eight) hours as needed for nausea or vomiting.   ondansetron  (ZOFRAN ) 8 MG tablet Take 1 tablet (8 mg total) by mouth every 8 (eight) hours as needed for nausea or vomiting.   OZEMPIC, 1 MG/DOSE, 4 MG/3ML SOPN Inject 1 mg into the skin once a week.   primidone (MYSOLINE) 50 MG tablet Take 25 mg by mouth 4 (four) times daily. (Patient taking differently: Take 25 mg by mouth at bedtime.)   QULIPTA 60 MG TABS Take 60 mg by mouth daily.   rosuvastatin (CRESTOR) 5 MG tablet Take 5 mg by mouth daily.   sertraline  (ZOLOFT ) 25 MG tablet Take 1 tablet (25 mg total) by mouth daily.   SUMAtriptan (IMITREX) 50 MG tablet as needed.   valACYclovir  (VALTREX ) 500 MG tablet TAKE 1 TABLET (500 MG TOTAL) BY MOUTH DAILY.   No facility-administered encounter  medications on file as of 11/30/2024.      Medical History: Past Medical History:  Diagnosis Date   Anemia    Graves' disease in remission    Stage 3a chronic kidney disease (HCC)    Type 1 diabetes (HCC)      Vital Signs: BP 120/80   Pulse 91   Temp (!) 97 F (36.1 C)   Ht 5' 4.5 (1.638 m)   Wt 145 lb (65.8 kg)   SpO2 99%   BMI 24.50 kg/m    Review of Systems  Constitutional:  Positive for fatigue.  Respiratory:  Positive for cough.   Gastrointestinal:  Positive for nausea.  Musculoskeletal:  Positive for myalgias.  Neurological:  Positive for dizziness.    Physical Exam Vitals reviewed.  Constitutional:      General: She is not in acute distress.    Appearance:  Normal appearance. She is normal weight. She is not ill-appearing.  HENT:     Head: Normocephalic.     Right Ear: Tympanic membrane and ear canal normal.     Left Ear: Tympanic membrane and ear canal normal.     Nose: Nose normal.     Mouth/Throat:     Mouth: Mucous membranes are moist.  Eyes:     General:        Right eye: No discharge.        Left eye: No discharge.     Pupils: Pupils are equal, round, and reactive to light.  Cardiovascular:     Rate and Rhythm: Normal rate and regular rhythm.     Pulses: Normal pulses.     Heart sounds: No murmur heard.    No friction rub.  Pulmonary:     Effort: Pulmonary effort is normal. No respiratory distress.     Breath sounds: Normal breath sounds. No wheezing.     Comments: Diminished  Musculoskeletal:     Cervical back: Normal range of motion. No rigidity.  Lymphadenopathy:     Cervical: No cervical adenopathy.  Neurological:     General: No focal deficit present.     Mental Status: She is alert and oriented to person, place, and time.  Psychiatric:        Mood and Affect: Mood normal.        Behavior: Behavior normal.    Assessment/Plan: 1. Acute cough (Primary) Take doxycycline  and albuterol , Follow up via MyChart messenger if symptoms fail to improve or may return to clinic as needed for worsening symptoms.   - doxycycline  (VIBRA -TABS) 100 MG tablet; Take 1 tablet (100 mg total) by mouth 2 (two) times daily.  Dispense: 20 tablet; Refill: 0 - albuterol  (VENTOLIN  HFA) 108 (90 Base) MCG/ACT inhaler; Inhale 2 puffs three times daily for one week.  Then use 2 puffs every 4-6 hours as needed for cough  Dispense: 8 g; Refill: 0  2. Medication side effect Possible that dizziness and general fatigue could be from newly started cymbalta .    3. Nausea Use zofran  as discussed.  - ondansetron  (ZOFRAN ) 4 MG tablet; Take 1 tablet (4 mg total) by mouth every 8 (eight) hours as needed for nausea or vomiting.  Dispense: 20 tablet; Refill:  1     General Counseling: Sonna verbalizes understanding of the findings of todays visit and agrees with plan of treatment. I have discussed any further diagnostic evaluation that may be needed or ordered today. We also reviewed her medications today. she has been encouraged to call the office  with any questions or concerns that should arise related to todays visit.   No orders of the defined types were placed in this encounter.   Meds ordered this encounter  Medications   doxycycline  (VIBRA -TABS) 100 MG tablet    Sig: Take 1 tablet (100 mg total) by mouth 2 (two) times daily.    Dispense:  20 tablet    Refill:  0   albuterol  (VENTOLIN  HFA) 108 (90 Base) MCG/ACT inhaler    Sig: Inhale 2 puffs three times daily for one week.  Then use 2 puffs every 4-6 hours as needed for cough    Dispense:  8 g    Refill:  0   ondansetron  (ZOFRAN ) 4 MG tablet    Sig: Take 1 tablet (4 mg total) by mouth every 8 (eight) hours as needed for nausea or vomiting.    Dispense:  20 tablet    Refill:  1    Time spent:15 Minutes    Juliene DOROTHA Howells AGNP-C Nurse Practitioner

## 2024-12-06 ENCOUNTER — Other Ambulatory Visit: Payer: Self-pay | Admitting: Nurse Practitioner

## 2024-12-06 DIAGNOSIS — F419 Anxiety disorder, unspecified: Secondary | ICD-10-CM

## 2024-12-06 DIAGNOSIS — F331 Major depressive disorder, recurrent, moderate: Secondary | ICD-10-CM

## 2024-12-06 DIAGNOSIS — E039 Hypothyroidism, unspecified: Secondary | ICD-10-CM

## 2024-12-08 NOTE — Telephone Encounter (Signed)
 Requested Prescriptions  Pending Prescriptions Disp Refills   sertraline  (ZOLOFT ) 25 MG tablet [Pharmacy Med Name: SERTRALINE  HCL 25 MG TABLET] 90 tablet 0    Sig: TAKE 1 TABLET (25 MG TOTAL) BY MOUTH DAILY.     Psychiatry:  Antidepressants - SSRI - sertraline  Passed - 12/08/2024 11:27 AM      Passed - AST in normal range and within 360 days    AST  Date Value Ref Range Status  10/16/2024 14 10 - 30 U/L Final         Passed - ALT in normal range and within 360 days    ALT  Date Value Ref Range Status  10/16/2024 13 6 - 29 U/L Final         Passed - Completed PHQ-2 or PHQ-9 in the last 360 days      Passed - Valid encounter within last 6 months    Recent Outpatient Visits           3 weeks ago Moderate episode of recurrent major depressive disorder [facility]   [place] [facility] [name] [name] [name], FNP   1 month ago Annual physical exam   [facility] [name] [name] [name], FNP               DULoxetine  (CYMBALTA ) 20 MG capsule [Pharmacy Med Name: DULOXETINE  HCL DR 20 MG CAP] 90 capsule 0    Sig: TAKE 1 CAPSULE BY MOUTH EVERY DAY     Psychiatry: Antidepressants - SNRI - duloxetine  Failed - 12/08/2024 11:27 AM      Failed - Cr in normal range and within 360 days    Creat  Date Value Ref Range Status  10/16/2024 1.66 (H) 0.50 - 0.97 mg/dL Final   Creatinine, Urine  Date Value Ref Range Status  10/16/2024 161 20 - 275 mg/dL Final         Passed - eGFR is 30 or above and within 360 days    eGFR  Date Value Ref Range Status  10/16/2024 42 (L) > OR = 60 mL/min/1.50m2 Final  01/05/2023 58 (L) >59 mL/min/1.73 Final         Passed - Completed PHQ-2 or PHQ-9 in the last 360 days      Passed - Last BP in normal range    BP Readings from Last 1 Encounters:  11/30/24 120/80         Passed - Valid encounter within last 6 months    Recent Outpatient Visits           3 weeks ago Moderate episode of recurrent major depressive disorder  [facility])   [facility] [name] [name] [name], FNP   1 month ago Annual physical exam   [facility] [name] [name] [name], [place]

## 2024-12-11 ENCOUNTER — Ambulatory Visit: Admitting: Nurse Practitioner
